# Patient Record
Sex: Female | Born: 1980 | Race: Black or African American | Hispanic: No | Marital: Married | Smoking: Never smoker
Health system: Southern US, Community
[De-identification: ages and names within clinical notes are randomized; demographics above are authoritative.]

## PROBLEM LIST (undated history)

## (undated) DIAGNOSIS — E669 Obesity, unspecified: Secondary | ICD-10-CM

## (undated) DIAGNOSIS — D649 Anemia, unspecified: Secondary | ICD-10-CM

## (undated) DIAGNOSIS — N939 Abnormal uterine and vaginal bleeding, unspecified: Secondary | ICD-10-CM

## (undated) DIAGNOSIS — D219 Benign neoplasm of connective and other soft tissue, unspecified: Secondary | ICD-10-CM

## (undated) DIAGNOSIS — N946 Dysmenorrhea, unspecified: Secondary | ICD-10-CM

## (undated) HISTORY — DX: Benign neoplasm of connective and other soft tissue, unspecified: D21.9

## (undated) HISTORY — PX: WISDOM TOOTH EXTRACTION: SHX21

## (undated) HISTORY — DX: Dysmenorrhea, unspecified: N94.6

## (undated) HISTORY — DX: Abnormal uterine and vaginal bleeding, unspecified: N93.9

## (undated) HISTORY — PX: ABDOMINAL HYSTERECTOMY: SHX81

---

## 2007-08-17 ENCOUNTER — Inpatient Hospital Stay (HOSPITAL_COMMUNITY): Admission: AD | Admit: 2007-08-17 | Discharge: 2007-08-17 | Payer: Self-pay | Admitting: Obstetrics and Gynecology

## 2007-08-24 ENCOUNTER — Inpatient Hospital Stay (HOSPITAL_COMMUNITY): Admission: AD | Admit: 2007-08-24 | Discharge: 2007-08-24 | Payer: Self-pay | Admitting: Obstetrics and Gynecology

## 2007-08-25 ENCOUNTER — Inpatient Hospital Stay (HOSPITAL_COMMUNITY): Admission: AD | Admit: 2007-08-25 | Discharge: 2007-08-25 | Payer: Self-pay | Admitting: Obstetrics and Gynecology

## 2007-10-23 ENCOUNTER — Inpatient Hospital Stay (HOSPITAL_COMMUNITY): Admission: AD | Admit: 2007-10-23 | Discharge: 2007-10-23 | Payer: Self-pay | Admitting: Obstetrics and Gynecology

## 2007-10-30 ENCOUNTER — Inpatient Hospital Stay (HOSPITAL_COMMUNITY): Admission: AD | Admit: 2007-10-30 | Discharge: 2007-10-30 | Payer: Self-pay | Admitting: Obstetrics and Gynecology

## 2007-11-07 ENCOUNTER — Inpatient Hospital Stay (HOSPITAL_COMMUNITY): Admission: AD | Admit: 2007-11-07 | Discharge: 2007-11-07 | Payer: Self-pay | Admitting: Obstetrics and Gynecology

## 2007-11-10 ENCOUNTER — Inpatient Hospital Stay (HOSPITAL_COMMUNITY): Admission: RE | Admit: 2007-11-10 | Discharge: 2007-11-10 | Payer: Self-pay | Admitting: Obstetrics and Gynecology

## 2007-11-18 ENCOUNTER — Inpatient Hospital Stay (HOSPITAL_COMMUNITY): Admission: RE | Admit: 2007-11-18 | Discharge: 2007-11-21 | Payer: Self-pay | Admitting: Obstetrics and Gynecology

## 2007-11-22 ENCOUNTER — Inpatient Hospital Stay (HOSPITAL_COMMUNITY): Admission: AD | Admit: 2007-11-22 | Discharge: 2007-11-22 | Payer: Self-pay | Admitting: Internal Medicine

## 2007-11-23 ENCOUNTER — Inpatient Hospital Stay (HOSPITAL_COMMUNITY): Admission: AD | Admit: 2007-11-23 | Discharge: 2007-11-23 | Payer: Self-pay | Admitting: Obstetrics and Gynecology

## 2007-11-25 ENCOUNTER — Emergency Department (HOSPITAL_COMMUNITY): Admission: EM | Admit: 2007-11-25 | Discharge: 2007-11-25 | Payer: Self-pay | Admitting: Emergency Medicine

## 2007-12-19 ENCOUNTER — Emergency Department (HOSPITAL_COMMUNITY): Admission: EM | Admit: 2007-12-19 | Discharge: 2007-12-19 | Payer: Self-pay | Admitting: Emergency Medicine

## 2007-12-23 ENCOUNTER — Emergency Department (HOSPITAL_COMMUNITY): Admission: EM | Admit: 2007-12-23 | Discharge: 2007-12-23 | Payer: Self-pay | Admitting: Emergency Medicine

## 2007-12-26 ENCOUNTER — Emergency Department (HOSPITAL_COMMUNITY): Admission: EM | Admit: 2007-12-26 | Discharge: 2007-12-27 | Payer: Self-pay | Admitting: Emergency Medicine

## 2007-12-27 ENCOUNTER — Emergency Department (HOSPITAL_COMMUNITY): Admission: EM | Admit: 2007-12-27 | Discharge: 2007-12-27 | Payer: Self-pay | Admitting: Emergency Medicine

## 2009-04-09 ENCOUNTER — Emergency Department (HOSPITAL_COMMUNITY): Admission: EM | Admit: 2009-04-09 | Discharge: 2009-04-09 | Payer: Self-pay | Admitting: Emergency Medicine

## 2009-08-05 IMAGING — CR DG CHEST 2V
2 series · 2 of 2 positions shown · non-contrast
Comparison: 11/23/07.

CLINICAL DATA: Shortness of breath.  Right chest pain.  Postpartum. 
 CHEST ? 2 VIEW:

[w chest pa]
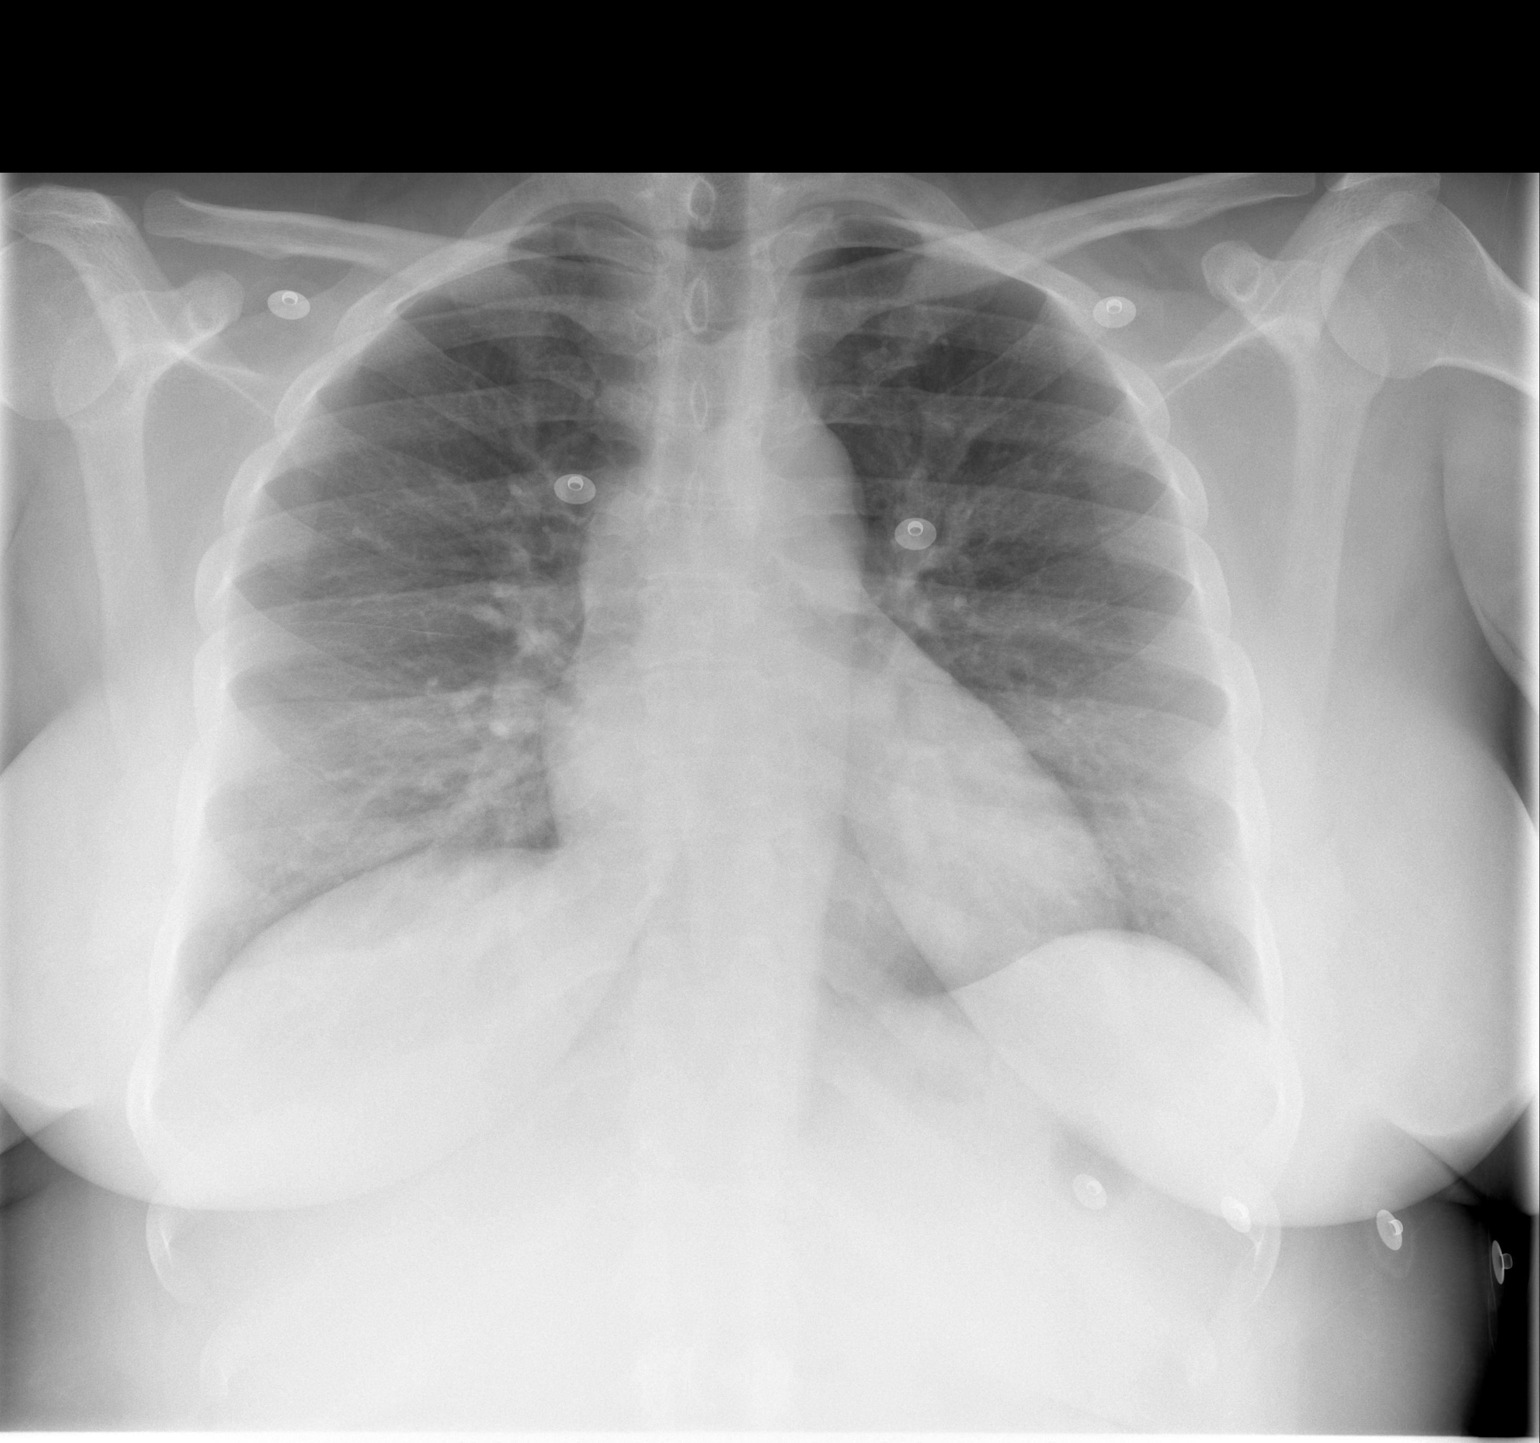

[w chest lat]
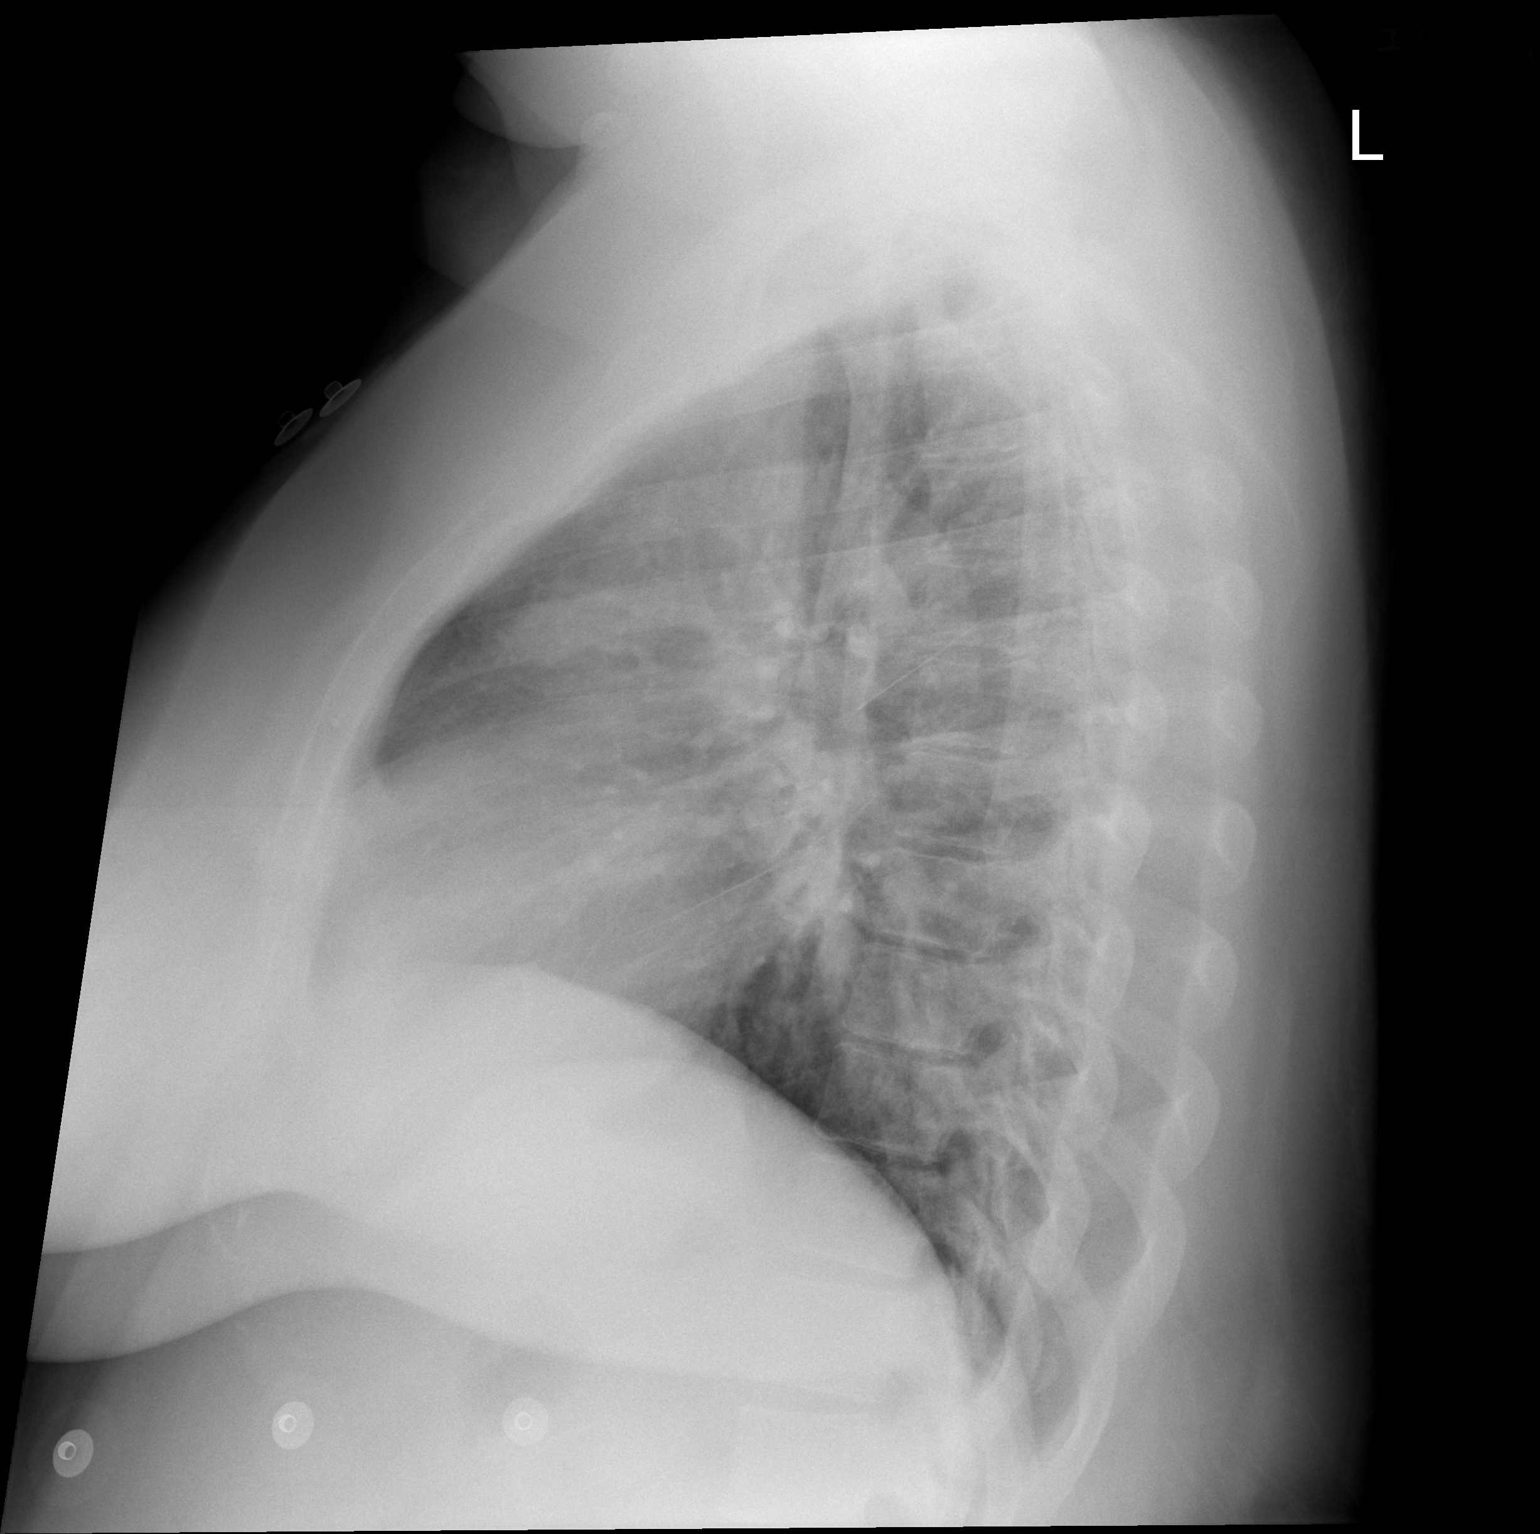

[2 of 2 positions shown; findings below may reference images not displayed]

FINDINGS: Cardiac and mediastinal contours appear unremarkable.  There is some faint linear opacity at the right lung base, which appears asymmetric and may represent bronchopneumonia.  The lungs appear otherwise clear.
IMPRESSION: Linear opacities at the right lung base raise the possibility of bronchopneumonia.

## 2009-12-29 ENCOUNTER — Other Ambulatory Visit: Admission: RE | Admit: 2009-12-29 | Discharge: 2009-12-29 | Payer: Self-pay | Admitting: Gynecology

## 2009-12-29 ENCOUNTER — Ambulatory Visit: Payer: Self-pay | Admitting: Gynecology

## 2010-10-07 ENCOUNTER — Encounter: Payer: Self-pay | Admitting: Obstetrics and Gynecology

## 2010-12-23 LAB — URINE MICROSCOPIC-ADD ON

## 2010-12-23 LAB — URINALYSIS, ROUTINE W REFLEX MICROSCOPIC
Glucose, UA: NEGATIVE mg/dL
Specific Gravity, Urine: 1.025 (ref 1.005–1.030)
pH: 6 (ref 5.0–8.0)

## 2011-01-25 ENCOUNTER — Encounter (INDEPENDENT_AMBULATORY_CARE_PROVIDER_SITE_OTHER): Payer: 59 | Admitting: Gynecology

## 2011-01-25 ENCOUNTER — Other Ambulatory Visit (HOSPITAL_COMMUNITY)
Admission: RE | Admit: 2011-01-25 | Discharge: 2011-01-25 | Disposition: A | Discharge status: Home / Self Care | Payer: 59 | Source: Ambulatory Visit | Attending: Gynecology | Admitting: Gynecology

## 2011-01-25 ENCOUNTER — Other Ambulatory Visit: Payer: Self-pay | Admitting: Gynecology

## 2011-01-25 DIAGNOSIS — Z13220 Encounter for screening for lipoid disorders: Secondary | ICD-10-CM

## 2011-01-25 DIAGNOSIS — Z833 Family history of diabetes mellitus: Secondary | ICD-10-CM

## 2011-01-25 DIAGNOSIS — Z124 Encounter for screening for malignant neoplasm of cervix: Secondary | ICD-10-CM | POA: Insufficient documentation

## 2011-01-25 DIAGNOSIS — Z01419 Encounter for gynecological examination (general) (routine) without abnormal findings: Secondary | ICD-10-CM

## 2011-01-25 DIAGNOSIS — R82998 Other abnormal findings in urine: Secondary | ICD-10-CM

## 2011-01-25 DIAGNOSIS — Z3041 Encounter for surveillance of contraceptive pills: Secondary | ICD-10-CM

## 2011-01-29 NOTE — Discharge Summary (Signed)
[name], [name]              ACCOUNT NO.:  [id number]   MEDICAL RECORD NO.:  [id number]          PATIENT TYPE:  INP   LOCATION:  9125                          FACILITY:  [facility]   PHYSICIAN:  Malachi Pro. Ambrose Mantle, M.D. DATE OF BIRTH:  1981-04-06   DATE OF ADMISSION:  11/18/2007  DATE OF DISCHARGE:  11/21/2007                               DISCHARGE SUMMARY   This is a 30 year old black female, para 0-1-1-1, gravida 3 who was at  39 weeks by last period compatible with an 8-week ultrasound with [facility] of  November 24, 2007 who presented for elective induction.  Blood group and  type was A+, negative antibody, RPR nonreactive, rubella immune,  hepatitis B surface antigen negative, HIV negative, GC and chlamydia  negative, cystic fibrosis negative, quad screen normal, 1-hour Glucola  118, group B strep negative.  The patient's prenatal course was  complicated by proteinuria with occasional slight increased blood  pressure and normal labs.  She had been on 17-hydroxyprogesterone for  prior preterm delivery thought to have a large for gestational age  infant.  She was followed with nonstress tests.  Her full prenatal  history and past medical history and physical exam are included in the  H&P.   In summary, the patient was admitted for induction, reached 8 cm  dilatation but thereafter failed to make progress and underwent a C-  section for arrest of dilatation and nonreassuring fetal heart rate  tracing.  After the C-section, the patient's progress was normal.  On  the evening of the second postop day, she did have a fever to 100.5 and  also had a chill, but her only symptom is that her breast milk came in.  she denied urinary/bowel symptoms.  She denied any abdominal tenderness.  Her incision looked good.  Staples removed and strips were applied.  Her  urine is going to be sent for urinalysis and culture on a cath specimen,  and she is discharged with instructions to take her temperature every  4  hours and report to me any fever above 100.4 degrees.  Initial  hemoglobin 11.7, hematocrit 35.0, white count 7,400, platelet count  381,000.  Follow-up hemoglobin 9.3.  RPR was nonreactive.   FINAL DIAGNOSES:  1. Intrauterine pregnancy at 39+ weeks with arrest of dilatation.  2. History of proteinuria.   OPERATION:  Low transverse cervical C-section.   FINAL CONDITION:  Improved.   INSTRUCTIONS:  Our regular discharge instruction booklet.  The patient  will have a catheterized urine specimen prior to discharge.  It will be  sent for urinalysis and culture.  The infant was a female infant, 8  pounds, 8 ounces, Apgars of 8 at 1 and 9 at 5 minutes, and there was a  nuchal cord x2.  Her staples have been removed and strips applied.   MEDICATIONS:  1. Percocet 5/325, 30 tablets, 1 every 4-6 hours as needed for pain.  2. Motrin 600 mg, 30 tablets, 1 every 6 hours as needed for pain.   She is to return to the office in 10 days for follow-up examination but  call with any fever or any other problems.      Malachi Pro. Ambrose Mantle, M.D.  Electronically Signed     TFH/MEDQ  D:  11/21/2007  T:  11/23/2007  Job:  [id number]

## 2011-01-29 NOTE — Op Note (Signed)
NAMEKHYLEI, [name]              ACCOUNT NO.:  [id number]   MEDICAL RECORD NO.:  [id number]          PATIENT TYPE:  INP   LOCATION:  9125                          FACILITY:  [facility]   PHYSICIAN:  Leighton Roach Meisinger, M.D.DATE OF BIRTH:  1980/11/26   DATE OF PROCEDURE:  11/18/2007  DATE OF DISCHARGE:                               OPERATIVE REPORT   PREOPERATIVE DIAGNOSES:  Intrauterine pregnancy at 39 weeks, arrest of  dilation, nonreassuring fetal heart tracing.   POSTOPERATIVE DIAGNOSES:  Intrauterine pregnancy at 39 weeks, arrest of  dilation, nonreassuring fetal heart tracing.   PROCEDURE:  Primary low transverse cesarean section without extensions.   SURGEON:  Zenaida Niece, M.D.   ANESTHESIA:  Epidural with added local block and some IV sedation.   SPECIMENS:  Placenta sent for cord blood collection and then to labor  and delivery.   FINDINGS:  The patient had normal gravid anatomy and delivered a viable  female infant with Apgars of 8 and 9 that weighed 8 pounds 8 ounces and  had a nuchal cord x2.   ESTIMATED BLOOD LOSS:  1200 mL.   COMPLICATIONS:  None.   PROCEDURE IN DETAIL:  The patient was taken to the operating room and  placed in the dorsal supine position with left lateral tilt.  Previously  placed epidural was dosed appropriately.  Abdomen was then prepped and  draped in the usual sterile fashion and a sterile drape applied.  The  level of her anesthesia was found to be adequate and abdomen was entered  via a standard Pfannenstiel incision.  During the incision she did  experience some discomfort just to the right of the midline and local  anesthetic was poured into the incision.  Epidural was further dosed and  she was able to tolerate the procedure.  Once the peritoneal cavity was  entered, an Alexis disposable self-retaining retractor was placed and  the lower uterine segment was well exposed.  A 4 cm transverse incision  was made in the lower uterine  segment.  Once the uterine cavity was  entered, the incision was extended bilaterally digitally.  The fetal  vertex was grasped and delivered through the incision atraumatically.  Mouth and nares were suctioned.  Nuchal cord x2 was reduced and the  remainder of the infant then delivered atraumatically.  Umbilical cord  was doubly clamped and cut and the infant handed to the awaiting  pediatric team.  Placenta delivered spontaneously and was sent for cord  blood collection.  The uterus was wiped dry with a clean lap pad and all  clots and debris removed.  Bowels did repeatedly protrude into the  incision and I was able to reduce this by removing the uterus and then  putting it back and pushing the bowel superior.  Uterine incision was  inspected and found to be free of extensions.  The uterine incision was  closed in one layer, being a running locking layer with #1 chromic.  The  needle came off towards the right angle, so another suture of #1 chromic  started at the right angle and met  this suture which was tied.  Some  bleeding from the right side was controlled with #1 chromic with  adequate hemostasis.  Tubes and ovaries were inspected and found to be  normal.  Uterine incision was again inspected and found to be  hemostatic.  Subfascial space was then irrigated and made hemostatic  with electrocautery.  Rectus muscles were reapproximated in the midline  with interrupted sutures of #1 chromic due to prolapsing omentum and  bowel.  Fascia was then closed in a running fashion starting at both  ends and meeting in the middle with 0 Vicryl.  Subcutaneous tissue was  then irrigated and made hemostatic with electrocautery.  Subcutaneous  tissue was then closed with running 2-0 plain gut suture.  Skin was  closed with staples followed by a sterile dressing.  The patient  tolerated the procedure well and was taken to the recovery room in  stable condition.  Counts were correct x2 and she was  given Ancef 1 g at  the beginning of the procedure.      Zenaida Niece, M.D.  Electronically Signed     TDM/MEDQ  D:  11/18/2007  T:  11/19/2007  Job:  [id number]

## 2011-06-07 LAB — URINALYSIS, ROUTINE W REFLEX MICROSCOPIC
Hgb urine dipstick: NEGATIVE
Specific Gravity, Urine: 1.02
Urobilinogen, UA: 0.2

## 2011-06-07 LAB — COMPREHENSIVE METABOLIC PANEL
ALT: 15
AST: 17
Calcium: 8.6
Creatinine, Ser: 0.55
GFR calc Af Amer: >60
Sodium: 137
Total Protein: 6.8

## 2011-06-07 LAB — LACTATE DEHYDROGENASE: LDH: 147

## 2011-06-07 LAB — CBC
MCHC: 32.8
RDW: 15.1

## 2011-06-10 LAB — COMPREHENSIVE METABOLIC PANEL
ALT: 22
Albumin: 2.2 — ABNORMAL LOW
Albumin: 2.4 — ABNORMAL LOW
Alkaline Phosphatase: 73
Alkaline Phosphatase: 83
BUN: 6
Calcium: 8.6
Calcium: 8.9
GFR calc Af Amer: >60
Potassium: 3.5
Potassium: 3.3 — ABNORMAL LOW
Sodium: 141
Total Protein: 6.2
Total Protein: 6.6

## 2011-06-10 LAB — URINALYSIS, ROUTINE W REFLEX MICROSCOPIC
Glucose, UA: NEGATIVE
Glucose, UA: NEGATIVE
Leukocytes, UA: NEGATIVE
Protein, ur: NEGATIVE
Specific Gravity, Urine: <1.005 — ABNORMAL LOW
Specific Gravity, Urine: 1.007
pH: 7.5
pH: 7.5

## 2011-06-10 LAB — CBC
HCT: 24.8 — ABNORMAL LOW
HCT: 27.9 — ABNORMAL LOW
Hemoglobin: 9.3 — ABNORMAL LOW
MCHC: 33.2
MCHC: 31.8
MCV: 79
Platelets: 381
Platelets: 432 — ABNORMAL HIGH
Platelets: 486 — ABNORMAL HIGH
RBC: 3.24 — ABNORMAL LOW
RBC: 3.48 — ABNORMAL LOW
RBC: 4.44
RDW: 15.1
RDW: 15.4
RDW: 14.7
WBC: 12.1 — ABNORMAL HIGH
WBC: 7.4
WBC: 8.2

## 2011-06-10 LAB — POCT CARDIAC MARKERS
Myoglobin, poc: 75.3
Operator id: 277751
Troponin i, poc: <0.05

## 2011-06-10 LAB — CULTURE, BLOOD (ROUTINE X 2): Culture: NO GROWTH

## 2011-06-10 LAB — B-NATRIURETIC PEPTIDE (CONVERTED LAB): Pro B Natriuretic peptide (BNP): 54

## 2011-06-10 LAB — DIFFERENTIAL
Basophils Relative: 0
Eosinophils Absolute: 0.1
Lymphs Abs: 2.3
Monocytes Absolute: 0.6
Monocytes Relative: 7
Neutro Abs: 6.6
Neutrophils Relative %: 68

## 2011-06-10 LAB — BLOOD GAS, ARTERIAL
Acid-Base Excess: 2.6 — ABNORMAL HIGH
FIO2: 0.21
pO2, Arterial: 88.9

## 2011-06-10 LAB — URINE MICROSCOPIC-ADD ON

## 2011-06-10 LAB — RPR: RPR Ser Ql: NONREACTIVE

## 2011-06-11 LAB — POCT I-STAT, CHEM 8
BUN: 10
Calcium, Ion: 1.15
Chloride: 103
Chloride: 103
Creatinine, Ser: 0.8
Glucose, Bld: 87
Glucose, Bld: 97
HCT: 33 — ABNORMAL LOW
Potassium: 3.8
Sodium: 140
TCO2: 26

## 2011-06-11 LAB — CBC
Hemoglobin: 10.1 — ABNORMAL LOW
MCHC: 32.8
MCV: 77.8 — ABNORMAL LOW
Platelets: 454 — ABNORMAL HIGH
RBC: 3.89
RBC: 4.04
WBC: 5.9
WBC: 7

## 2011-06-11 LAB — DIFFERENTIAL
Lymphocytes Relative: 32
Lymphs Abs: 2.2
Monocytes Relative: 10
Neutrophils Relative %: 54

## 2011-06-11 LAB — D-DIMER, QUANTITATIVE: D-Dimer, Quant: 0.46

## 2011-06-11 LAB — POCT CARDIAC MARKERS
CKMB, poc: <1 — ABNORMAL LOW
CKMB, poc: <1 — ABNORMAL LOW
Myoglobin, poc: 39.1
Myoglobin, poc: 43.5
Operator id: 294521
Troponin i, poc: <0.05

## 2011-06-11 LAB — B-NATRIURETIC PEPTIDE (CONVERTED LAB): Pro B Natriuretic peptide (BNP): <30

## 2011-06-24 LAB — URINE MICROSCOPIC-ADD ON

## 2011-06-24 LAB — URINALYSIS, ROUTINE W REFLEX MICROSCOPIC
Ketones, ur: >80 — AB
Nitrite: NEGATIVE
Protein, ur: 30 — AB
pH: 6

## 2011-08-06 ENCOUNTER — Encounter: Payer: Self-pay | Admitting: Medical

## 2011-08-06 ENCOUNTER — Ambulatory Visit (INDEPENDENT_AMBULATORY_CARE_PROVIDER_SITE_OTHER): Payer: 59 | Admitting: Medical

## 2011-08-06 VITALS — BP 128/80 | HR 88 | Temp 98.0°F | Resp 12 | Ht 63.0 in | Wt 276.0 lb

## 2011-08-06 DIAGNOSIS — Z0000 Encounter for general adult medical examination without abnormal findings: Secondary | ICD-10-CM

## 2011-08-06 DIAGNOSIS — Z23 Encounter for immunization: Secondary | ICD-10-CM

## 2011-08-06 DIAGNOSIS — N63 Unspecified lump in unspecified breast: Secondary | ICD-10-CM

## 2011-08-06 DIAGNOSIS — N6320 Unspecified lump in the left breast, unspecified quadrant: Secondary | ICD-10-CM

## 2011-08-06 DIAGNOSIS — E669 Obesity, unspecified: Secondary | ICD-10-CM

## 2011-08-06 LAB — POCT URINALYSIS DIPSTICK
Bilirubin, UA: NEGATIVE
Glucose, UA: NEGATIVE
Nitrite, UA: NEGATIVE
Urobilinogen, UA: NEGATIVE

## 2011-08-06 NOTE — Progress Notes (Signed)
Subjective:   HPI  [name] is a 30 y.o. female who presents for a complete physical.  She is a new patient today.  Was going to [facility] prior.  Last physical 3 years ago.  She notes a knot on left breast x 2 months, not going away.  She is concerned about this.  Denies redness, drainage, or heat.  Denies injury or trauma.  Sees gynecology, last pap smear 5/12, normal.  Last tetanus shot probably > 10 years.  Declines flu vaccine.  She wants advise on her weight.  She walks for exercise but eats 2 meals a day, and one of which is usually fast food.  She has had a faint heart murmur, asymptomatic since her last pregnancy.  No echocardiogram or other evaluation to date.   Reviewed their medical, surgical, family, social, medication, and allergy history and updated chart as appropriate.   Past Medical History  Diagnosis Date  . History of cesarean section 2009  . Pneumonia 2007    Past Surgical History  Procedure Date  . Cesarean section     Family History  Problem Relation Age of Onset  . Diabetes Mother   . Hypertension Mother   . Cancer Maternal Aunt     breast  . Cancer Maternal Grandmother     breast  . Heart disease Neg Hx   . Stroke Neg Hx     History   Social History  . Marital Status: Married    Spouse Name: N/A    Number of Children: N/A  . Years of Education: N/A   Occupational History  . Customer Service  At [employer]   Social History Main Topics  . Smoking status: Never Smoker   . Smokeless tobacco: Not on file  . Alcohol Use: No  . Drug Use: No  . Sexually Active:    Other Topics Concern  . Not on file   Social History Narrative   Married, children ages 5yo and 3yo, walks for exercise    Current Outpatient Prescriptions on File Prior to Visit  Medication Sig Dispense Refill  . norethindrone-ethinyl estradiol (MICROGESTIN 1/20) 1-20 MG-MCG per tablet Take 1 tablet by mouth daily.          No Known Allergies  Review of  Systems Constitutional: -fever, -chills, -sweats, -unexpected weight change, -anorexia, -fatigue Allergy: -sneezing, -itching, -congestion Dermatology: denies changing moles, rash, +lumps, new worrisome lesions ENT: +runny nose, -ear pain, -sore throat, -hoarseness, -sinus pain, -teeth pain, -tinnitus, -hearing loss, -epistaxis Cardiology:  -chest pain, -palpitations, -edema, -orthopnea, -paroxysmal nocturnal dyspnea Respiratory: -cough, -shortness of breath, -dyspnea on exertion, -wheezing, -hemoptysis Gastroenterology: -abdominal pain, -nausea, -vomiting, -diarrhea, -constipation, -blood in stool, -changes in bowel movement, -dysphagia Hematology: -bleeding or bruising problems Musculoskeletal: -arthralgias, -myalgias, -joint swelling, -back pain, -neck pain, -cramping, -gait changes Ophthalmology: -vision changes, -eye redness, -itching, -discharge Urology: -dysuria, -difficulty urinating, -hematuria, -urinary frequency, -urgency, incontinence Neurology: -headache, -weakness, -tingling, -numbness, -speech abnormality, -memory loss, -falls, -dizziness Psychology:  -depressed mood, -agitation, -sleep problems    Objective:   Physical Exam  Filed Vitals:   08/06/11 0848  BP: 128/80  Pulse: 88  Temp: 98 F (36.7 C)    General appearance: alert, no distress, WD/WN, obese black female Skin: no worrisome lesions HEENT: normocephalic, conjunctiva/corneas normal, sclerae anicteric, PERRLA, EOMi, nares patent, no discharge or erythema, pharynx normal Oral cavity: MMM, tongue normal, teeth in good repair Neck: supple, no lymphadenopathy, no thyromegaly, no masses, normal ROM, no bruits Chest:  non tender, normal shape and expansion Heart: RRR, normal S1, S2, faint I-II/VI systolic brief murmur  Lungs: CTA bilaterally, no wheezes, rhonchi, or rales Abdomen: +bs, soft, non tender, non distended, no masses, no hepatomegaly, no splenomegaly, no bruits Back: non tender, normal ROM, no  scoliosis Musculoskeletal: upper extremities non tender, no obvious deformity, normal ROM throughout, lower extremities non tender, no obvious deformity, normal ROM throughout Extremities: no edema, no cyanosis, no clubbing Pulses: 2+ symmetric, upper and lower extremities, normal cap refill Neurological: alert, oriented x 3, CN2-12 intact, strength normal upper extremities and lower extremities, sensation normal throughout, DTRs 2+ throughout, no cerebellar signs, gait normal Psychiatric: normal affect, behavior normal, pleasant  Breast: left breast medially at skin fold with 8mm nodular lesion that is most suggestive of cyst, but its not well defined or well formed.  Its is superficial. No induration, erythema, fluctuance. Gyn/rectal: deferred.     Assessment and Plan :    Encounter Diagnoses  Name Primary?  . Routine general medical examination at a health care facility Yes  . Lump of breast, left   . Need for Tdap vaccination   . Obesity     Physical exam - discussed healthy lifestyle, diet, exercise, preventative care, vaccinations, and addressed their concerns.  Handout given.  Lump - most likely a cyst, superficial.  Advised warm compresses, and recheck 32mo.  Updated Tdap vaccine today.  Obesity - discussed diet, weight loss strategies.  Cut out fast food, increase walking and intensity of exercise.  Will call with lab results.   Follow-up pending labs.

## 2011-08-06 NOTE — Patient Instructions (Addendum)
Obesity Obesity is defined as having a body mass index (BMI) of 30 or more. To calculate your BMI divide your weight in pounds by your height in inches squared and multiply that product by 703. Major illnesses resulting from long-term obesity include:  Stroke.   Heart disease.   Diabetes.   Many cancers.   Arthritis.  Obesity also complicates recovery from many other medical problems.  CAUSES   A history of obesity in your parents.   Thyroid hormone imbalance.   Environmental factors such as excess calorie intake and physical inactivity.  TREATMENT  A healthy weight loss program includes:  A calorie restricted diet based on individual calorie needs.   Increased physical activity (exercise).  An exercise program is just as important as the right low-calorie diet.  Weight-loss medicines should be used only under the supervision of your physician. These medicines help, but only if they are used with diet and exercise programs. Medicines can have side effects including nervousness, nausea, abdominal pain, diarrhea, headache, drowsiness, and depression.  An unhealthy weight loss program includes:  Fasting.   Fad diets.   Supplements and drugs.  These choices do not succeed in long-term weight control.  HOME CARE INSTRUCTIONS  To help you make the needed dietary changes:   Exercise and perform physical activity as directed by your caregiver.   Keep a daily record of everything you eat. There are many free websites to help you with this. It may be helpful to measure your foods so you can determine if you are eating the correct portion sizes.   Use low-calorie cookbooks or take special cooking classes.   Avoid alcohol. Drink more water and drinks with no calories.   Take vitamins and supplements only as recommended by your caregiver.   Weight loss support groups, Registered Dieticians, counselors, and stress reduction education can also be very helpful.  Document Released:  10/10/2004 Document Revised: 05/15/2011 Document Reviewed: 08/09/2007 [facility] Patient Information 2012 [place], [place].    Preventative Care for Adults - Female      MAINTAIN REGULAR HEALTH EXAMS:  A routine yearly physical is a good way to check in with your primary care provider about your health and preventive screening. It is also an opportunity to share updates about your health and any concerns you have, and receive a thorough all-over exam.   Most health insurance companies pay for at least some preventative services.  Check with your health plan for specific coverages.  WHAT PREVENTATIVE SERVICES DO WOMEN NEED?  Adult women should have their weight and blood pressure checked regularly.   Women age 52 and older should have their cholesterol levels checked regularly.  Women should be screened for cervical cancer with a Pap smear and pelvic exam beginning at either age 68, or 3 years after they become sexually activity.    Breast cancer screening generally begins at age 62 with a mammogram and breast exam by your primary care provider.    Beginning at age 71 and continuing to age 86, women should be screened for colorectal cancer.  Certain people may need continued testing until age 4.  Updating vaccinations is part of preventative care.  Vaccinations help protect against diseases such as the flu.  Osteoporosis is a disease in which the bones lose minerals and strength as we age. Women ages 55 and over should discuss this with their caregivers, as should women after menopause who have other risk factors.  Lab tests are generally done as part of  preventative care to screen for anemia and blood disorders, to screen for problems with the kidneys and liver, to screen for bladder problems, to check blood sugar, and to check your cholesterol level.  Preventative services generally include counseling about diet, exercise, avoiding tobacco, drugs, excessive alcohol consumption, and  sexually transmitted infections.    GENERAL RECOMMENDATIONS FOR GOOD HEALTH:  Healthy diet:  Eat a variety of foods, including fruit, vegetables, animal or vegetable protein, such as meat, fish, chicken, and eggs, or beans, lentils, tofu, and grains, such as rice.  Drink plenty of water daily.  Decrease saturated fat in the diet, avoid lots of red meat, processed foods, sweets, fast foods, and fried foods.  Exercise:  Aerobic exercise helps maintain good heart health. At least 30-40 minutes of moderate-intensity exercise is recommended. For example, a brisk walk that increases your heart rate and breathing. This should be done on most days of the week.   Find a type of exercise or a variety of exercises that you enjoy so that it becomes a part of your daily life.  Examples are running, walking, swimming, water aerobics, and biking.  For motivation and support, explore group exercise such as aerobic class, spin class, Zumba, Yoga,or  martial arts, etc.    Set exercise goals for yourself, such as a certain weight goal, walk or run in a race such as a 5k walk/run.  Speak to your primary care provider about exercise goals.  Disease prevention:  If you smoke or chew tobacco, find out from your caregiver how to quit. It can literally save your life, no matter how long you have been a tobacco user. If you do not use tobacco, never begin.   Maintain a healthy diet and normal weight. Increased weight leads to problems with blood pressure and diabetes.   The Body Mass Index or BMI is a way of measuring how much of your body is fat. Having a BMI above 27 increases the risk of heart disease, diabetes, hypertension, stroke and other problems related to obesity. Your caregiver can help determine your BMI and based on it develop an exercise and dietary program to help you achieve or maintain this important measurement at a healthful level.  High blood pressure causes heart and blood vessel problems.   Persistent high blood pressure should be treated with medicine if weight loss and exercise do not work.   Fat and cholesterol leaves deposits in your arteries that can block them. This causes heart disease and vessel disease elsewhere in your body.  If your cholesterol is found to be high, or if you have heart disease or certain other medical conditions, then you may need to have your cholesterol monitored frequently and be treated with medication.   Ask if you should have a cardiac stress test if your history suggests this. A stress test is a test done on a treadmill that looks for heart disease. This test can find disease prior to there being a problem.  Menopause can be associated with physical symptoms and risks. Hormone replacement therapy is available to decrease these. You should talk to your caregiver about whether starting or continuing to take hormones is right for you.   Osteoporosis is a disease in which the bones lose minerals and strength as we age. This can result in serious bone fractures. Risk of osteoporosis can be identified using a bone density scan. Women ages 101 and over should discuss this with their caregivers, as should women after menopause who  have other risk factors. Ask your caregiver whether you should be taking a calcium supplement and Vitamin D, to reduce the rate of osteoporosis.   Avoid drinking alcohol in excess (more than two drinks per day).  Avoid use of street drugs. Do not share needles with anyone. Ask for professional help if you need assistance or instructions on stopping the use of alcohol, cigarettes, and/or drugs.  Brush your teeth twice a day with fluoride toothpaste, and floss once a day. Good oral hygiene prevents tooth decay and gum disease. The problems can be painful, unattractive, and can cause other health problems. Visit your dentist for a routine oral and dental check up and preventive care every 6-12 months.   Look at your skin regularly.  Use a  mirror to look at your back. Notify your caregivers of changes in moles, especially if there are changes in shapes, colors, a size larger than a pencil eraser, an irregular border, or development of new moles.  Safety:  Use seatbelts 100% of the time, whether driving or as a passenger.  Use safety devices such as hearing protection if you work in environments with loud noise or significant background noise.  Use safety glasses when doing any work that could send debris in to the eyes.  Use a helmet if you ride a bike or motorcycle.  Use appropriate safety gear for contact sports.  Talk to your caregiver about gun safety.  Use sunscreen with a SPF (or skin protection factor) of 15 or greater.  Lighter skinned people are at a greater risk of skin cancer. Don't forget to also wear sunglasses in order to protect your eyes from too much damaging sunlight. Damaging sunlight can accelerate cataract formation.   Practice safe sex. Use condoms. Condoms are used for birth control and to help reduce the spread of sexually transmitted infections (or STIs).  Some of the STIs are gonorrhea (the clap), chlamydia, syphilis, trichomonas, herpes, HPV (human papilloma virus) and HIV (human immunodeficiency virus) which causes AIDS. The herpes, HIV and HPV are viral illnesses that have no cure. These can result in disability, cancer and death.   Keep carbon monoxide and smoke detectors in your home functioning at all times. Change the batteries every 6 months or use a model that plugs into the wall.   Vaccinations:  Stay up to date with your tetanus shots and other required immunizations. You should have a booster for tetanus every 10 years. Be sure to get your flu shot every year, since 5%-20% of the [place] population comes down with the flu. The flu vaccine changes each year, so being vaccinated once is not enough. Get your shot in the fall, before the flu season peaks.   Other vaccines to consider:  Human Papilloma  Virus or HPV causes cancer of the cervix, and other infections that can be transmitted from person to person. There is a vaccine for HPV, and females should get immunized between the ages of 18 and 86. It requires a series of 3 shots.   Pneumococcal vaccine to protect against certain types of pneumonia.  This is normally recommended for adults age 73 or older.  However, adults younger than 31 years old with certain underlying conditions such as diabetes, heart or lung disease should also receive the vaccine.  Shingles vaccine to protect against Varicella Zoster if you are older than age 29, or younger than 31 years old with certain underlying illness.  Hepatitis A vaccine to protect against a form of  infection of the liver by a virus acquired from food.  Hepatitis B vaccine to protect against a form of infection of the liver by a virus acquired from blood or body fluids, particularly if you work in health care.  If you plan to travel internationally, check with your local health department for specific vaccination recommendations.  Cancer Screening:  Breast cancer screening is essential to preventive care for women. All women age 75 and older should perform a breast self-exam every month. At age 51 and older, women should have their caregiver complete a breast exam each year. Women at ages 7 and older should have a mammogram (x-ray film) of the breasts. Your caregiver can discuss how often you need mammograms.    Cervical cancer screening includes taking a Pap smear (sample of cells examined under a microscope) from the cervix (end of the uterus). It also includes testing for HPV (Human Papilloma Virus, which can cause cervical cancer). Screening and a pelvic exam should begin at age 70, or 3 years after a woman becomes sexually active. Screening should occur every year, with a Pap smear but no HPV testing, up to age 29. After age 84, you should have a Pap smear every 3 years with HPV testing, if no  HPV was found previously.   Most routine colon cancer screening begins at the age of 34. On a yearly basis, doctors may provide special easy to use take-home tests to check for hidden blood in the stool. Sigmoidoscopy or colonoscopy can detect the earliest forms of colon cancer and is life saving. These tests use a small camera at the end of a tube to directly examine the colon. Speak to your caregiver about this at age 83, when routine screening begins (and is repeated every 5 years unless early forms of pre-cancerous polyps or small growths are found).

## 2011-08-07 LAB — COMPREHENSIVE METABOLIC PANEL
ALT: 15 U/L (ref 0–35)
AST: 14 U/L (ref 0–37)
Albumin: 3.8 g/dL (ref 3.5–5.2)
Alkaline Phosphatase: 69 U/L (ref 39–117)
Potassium: 4 mEq/L (ref 3.5–5.3)
Sodium: 138 mEq/L (ref 135–145)
Total Protein: 7.2 g/dL (ref 6.0–8.3)

## 2011-08-07 LAB — LIPID PANEL
Cholesterol: 189 mg/dL (ref 0–200)
Triglycerides: 67 mg/dL (ref <150)

## 2011-08-07 LAB — CBC WITH DIFFERENTIAL/PLATELET
Basophils Relative: 0 % (ref 0–1)
Eosinophils Absolute: 0.2 10*3/uL (ref 0.0–0.7)
MCH: 25.5 pg — ABNORMAL LOW (ref 26.0–34.0)
MCHC: 31.2 g/dL (ref 30.0–36.0)
Monocytes Relative: 8 % (ref 3–12)
Neutrophils Relative %: 49 % (ref 43–77)
Platelets: 483 10*3/uL — ABNORMAL HIGH (ref 150–400)
RDW: 13.5 % (ref 11.5–15.5)

## 2012-01-30 ENCOUNTER — Ambulatory Visit (INDEPENDENT_AMBULATORY_CARE_PROVIDER_SITE_OTHER): Payer: 59 | Admitting: Gynecology

## 2012-01-30 ENCOUNTER — Encounter: Payer: Self-pay | Admitting: Gynecology

## 2012-01-30 VITALS — BP 122/86 | Ht 63.0 in | Wt 265.0 lb

## 2012-01-30 DIAGNOSIS — Z131 Encounter for screening for diabetes mellitus: Secondary | ICD-10-CM

## 2012-01-30 DIAGNOSIS — Z13220 Encounter for screening for lipoid disorders: Secondary | ICD-10-CM

## 2012-01-30 DIAGNOSIS — Z01419 Encounter for gynecological examination (general) (routine) without abnormal findings: Secondary | ICD-10-CM

## 2012-01-30 LAB — GLUCOSE, RANDOM: Glucose, Bld: 87 mg/dL (ref 70–99)

## 2012-01-30 LAB — LIPID PANEL
HDL: 53 mg/dL (ref >39)
Total CHOL/HDL Ratio: 3.2 Ratio
Triglycerides: 73 mg/dL (ref <150)

## 2012-01-30 NOTE — Patient Instructions (Signed)
Follow up with birth control decision. Otherwise follow up in one year for annual gynecologic exam.

## 2012-01-30 NOTE — Progress Notes (Signed)
[name] [birth year] [id number]        31 y.o.  for annual exam.  Wants to talk about birth control options.  Past medical history,surgical history, medications, allergies, family history and social history were all reviewed and documented in the EPIC chart. ROS:  Was performed and pertinent positives and negatives are included in the history.  Exam: Sherrilyn Rist chaperone present Filed Vitals:   01/30/12 1539  BP: 122/86   General appearance  Normal Skin grossly normal Head/Neck normal with no cervical or supraclavicular adenopathy thyroid normal Lungs  clear Cardiac RR, without RMG Abdominal  soft, nontender, without masses, organomegaly or hernia Breasts  examined lying and sitting without masses, retractions, discharge or axillary adenopathy. Pelvic  Ext/BUS/vagina  normal   Cervix  normal   Uterus  anteverted, normal size, shape and contour, midline and mobile nontender   Adnexa  Without masses or tenderness    Anus and perineum  normal   Rectovaginal  normal sphincter tone without palpated masses or tenderness.    Assessment/Plan:  31 y.o. female for annual exam.    1. Contraceptive management. I again reviewed options with her to include pill patch ring, Depo-Provera, Implanon, Mirena/ParaGard IUD sterilization to include laparoscopic tubal, Essure and vasectomy. Risks/benefits, pros/cons reviewed for each method. I strongly recommended she consider ring IUD as her periods are moderate in that this will help lighten them as well as provide her with contraception. The issues of surgery and risks, noting her weight, to include internal organ damage, recovery issues and expense were all reviewed. She wants to think about options we'll follow up with her decision. She's currently off of the birth control pills and need for contraception reviewed. 2. Pap smear. Patient has no history of abnormal Pap smears with last Pap 2012. She has several normal records in her chart. No Pap was done  today. I discussed current screening recommendations and will plan on every 3 to 5 year interval. 3. Breast health. SBE monthly reviewed. Start mammograms closer to 40. 4. Health maintenance. Baseline CBC glucose lipid profile urinalysis ordered.    Dara Lords MD, 4:02 PM 01/30/2012

## 2012-01-31 LAB — CBC WITH DIFFERENTIAL/PLATELET
Eosinophils Absolute: 0.2 10*3/uL (ref 0.0–0.7)
Eosinophils Relative: 3 % (ref 0–5)
Hemoglobin: 11.9 g/dL — ABNORMAL LOW (ref 12.0–15.0)
Lymphocytes Relative: 45 % (ref 12–46)
Lymphs Abs: 3.1 10*3/uL (ref 0.7–4.0)
MCH: 25.9 pg — ABNORMAL LOW (ref 26.0–34.0)
MCV: 79.3 fL (ref 78.0–100.0)
Monocytes Relative: 7 % (ref 3–12)
Neutrophils Relative %: 45 % (ref 43–77)
RBC: 4.6 MIL/uL (ref 3.87–5.11)
WBC: 6.9 10*3/uL (ref 4.0–10.5)

## 2012-01-31 LAB — URINALYSIS W MICROSCOPIC + REFLEX CULTURE
Casts: NONE SEEN
Glucose, UA: NEGATIVE mg/dL
Hgb urine dipstick: NEGATIVE
Protein, ur: NEGATIVE mg/dL
pH: 6 (ref 5.0–8.0)

## 2012-02-01 LAB — URINE CULTURE
Colony Count: NO GROWTH
Organism ID, Bacteria: NO GROWTH

## 2012-02-06 ENCOUNTER — Encounter: Payer: Self-pay | Admitting: *Deleted

## 2012-02-06 NOTE — Progress Notes (Signed)
Patient ID: [name], female   DOB: 09-05-1981, 31 y.o.   MRN: [id number] Per Tiffany at [facility] insurance covers 100% of Mirena and insertion. KW

## 2012-02-17 ENCOUNTER — Encounter: Payer: Self-pay | Admitting: Gynecology

## 2012-02-17 ENCOUNTER — Ambulatory Visit (INDEPENDENT_AMBULATORY_CARE_PROVIDER_SITE_OTHER): Payer: 59 | Admitting: Gynecology

## 2012-02-17 DIAGNOSIS — Z30430 Encounter for insertion of intrauterine contraceptive device: Secondary | ICD-10-CM

## 2012-02-17 MED ORDER — LEVONORGESTREL 20 MCG/24HR IU IUD: INTRAUTERINE_SYSTEM | Freq: Once | INTRAUTERINE | Status: DC

## 2012-02-17 NOTE — Patient Instructions (Signed)
Follow up in one month for post IUD insertional check up. 

## 2012-02-17 NOTE — Progress Notes (Signed)
Patient presents for Mirena IUD placement. She has read the literature and has decided to go ahead with the IUD. She is on a normal menses currently. I reviewed with her the insertional process and the risks to include infection, uterine perforation requiring surgery to remove, migration and failure with pregnancy. Patient has no questions, she has read through the booklet and signed the consent form.  Exam with Sherrilyn Rist chaperone present External BUS vagina normal. Cervix normal with slight menses flow. Uterus normal size midline mobile nontender. Adnexa without masses or tenderness.  Procedure: Cervix was visualized, cleansed with Betadine, grasped with a single-tooth tenaculum and sounded in a Mirena IUD was placed according to manufacturer's recommendations without difficulty. The strings were trimmed and the patient tolerated well. She will follow up in one month for a postinsertional check.

## 2012-02-18 ENCOUNTER — Ambulatory Visit: Payer: 59 | Admitting: Gynecology

## 2012-03-18 ENCOUNTER — Ambulatory Visit: Payer: 59 | Admitting: Gynecology

## 2012-03-26 ENCOUNTER — Encounter: Payer: Self-pay | Admitting: Gynecology

## 2012-03-26 ENCOUNTER — Ambulatory Visit (INDEPENDENT_AMBULATORY_CARE_PROVIDER_SITE_OTHER): Payer: 59 | Admitting: Gynecology

## 2012-03-26 DIAGNOSIS — Z30431 Encounter for routine checking of intrauterine contraceptive device: Secondary | ICD-10-CM

## 2012-03-26 NOTE — Progress Notes (Signed)
Patient presents in follow up having had a Mirena IUD placed last month. She's done well without significant irregular bleeding or pain.  Exam with Sherrilyn Rist Asst. Abdomen soft nontender without masses guarding rebound organomegaly. Pelvic external BUS vagina normal. Cervix normal with IUD string visualized at external os. Uterus normal size midline mobile nontender. Adnexa without masses or tenderness.  Assessment and plan: IUD follow up check doing well. Will keep menstrual calendar. We'll follow of May 2014 for annual exam. Sooner follow up if significant irregular bleeding or other issues.

## 2012-03-26 NOTE — Patient Instructions (Signed)
Keep menstrual calendar. As long as not significant irregular bleeding follow up May 2014 for annual exam, sooner if any issues.

## 2013-03-09 ENCOUNTER — Encounter: Payer: Self-pay | Admitting: Gynecology

## 2013-03-29 ENCOUNTER — Encounter: Payer: Self-pay | Admitting: Gynecology

## 2013-03-29 ENCOUNTER — Ambulatory Visit (INDEPENDENT_AMBULATORY_CARE_PROVIDER_SITE_OTHER): Payer: 59 | Admitting: Gynecology

## 2013-03-29 VITALS — BP 130/80 | Ht 62.0 in | Wt 250.0 lb

## 2013-03-29 DIAGNOSIS — N611 Abscess of the breast and nipple: Secondary | ICD-10-CM

## 2013-03-29 DIAGNOSIS — L02239 Carbuncle of trunk, unspecified: Secondary | ICD-10-CM

## 2013-03-29 DIAGNOSIS — Z01419 Encounter for gynecological examination (general) (routine) without abnormal findings: Secondary | ICD-10-CM

## 2013-03-29 DIAGNOSIS — Z30431 Encounter for routine checking of intrauterine contraceptive device: Secondary | ICD-10-CM

## 2013-03-29 NOTE — Patient Instructions (Signed)
Follow up in one year, sooner as needed. 

## 2013-03-29 NOTE — Progress Notes (Signed)
[name] [birth year] [id number]        32 y.o.  [id number] for annual exam.  Doing well without complaints.  Past medical history,surgical history, medications, allergies, family history and social history were all reviewed and documented in the EPIC chart.  ROS:  Performed and pertinent positives and negatives are included in the history, assessment and plan .  Exam: Kim assistant Filed Vitals:   03/29/13 1429  BP: 130/80  Height: 5\' 2"  (1.575 m)  Weight: 250 lb (113.399 kg)   General appearance  Normal Skin grossly normal Head/Neck normal with no cervical or supraclavicular adenopathy thyroid normal Lungs  clear Cardiac RR, without RMG Abdominal  soft, nontender, without masses, organomegaly or hernia Breasts  examined lying and sitting without masses, retractions, discharge or axillary adenopathy. Small boil left breast 7:00 periphery Pelvic  Ext/BUS/vagina  normal   Cervix  normal with IUD string visualized  Uterus  anteverted, normal size, shape and contour, midline and mobile nontender   Adnexa  Without masses or tenderness    Anus and perineum  normal   Rectovaginal  normal sphincter tone without palpated masses or tenderness.    Assessment/Plan:  32 y.o. [id number] female for annual exam.   1. Patient notes some recurrent bilateral breast boils that come and go. She does have a smaller now that had drained and is resolving. They're classic in appearance. I reviewed symptomatic treatment with warm soaks when they occur. If she would have any persistent or enlarging areas she'll present for evaluation. SBE monthly reviewed. 2. Mirena IUD 02/2012. Doing well with scant to absent menses. IUD string visualized. 3. Pap smear 2012. A Pap smear done today. No history of abnormal Pap smears. Plan repeat next year 3 year interval. 4. Health maintenance. No blood work done today as it is all done through her primary physician's office. Followup one year, sooner as needed.  Note:  This document was prepared with digital dictation and possible smart phrase technology. Any transcriptional errors that result from this process are unintentional.   Dara Lords MD, 2:49 PM 03/29/2013

## 2013-03-30 LAB — URINALYSIS W MICROSCOPIC + REFLEX CULTURE
Bacteria, UA: NONE SEEN
Bilirubin Urine: NEGATIVE
Casts: NONE SEEN
Crystals: NONE SEEN
Ketones, ur: NEGATIVE mg/dL
Specific Gravity, Urine: 1.03 (ref 1.005–1.030)
pH: 6.5 (ref 5.0–8.0)

## 2013-04-20 ENCOUNTER — Encounter (HOSPITAL_COMMUNITY): Payer: Self-pay | Admitting: Emergency Medicine

## 2013-04-20 ENCOUNTER — Emergency Department (HOSPITAL_COMMUNITY)
Admission: EM | Admit: 2013-04-20 | Discharge: 2013-04-21 | Disposition: A | Discharge status: Home / Self Care | Payer: 59 | Attending: Emergency Medicine | Admitting: Emergency Medicine

## 2013-04-20 ENCOUNTER — Emergency Department (HOSPITAL_COMMUNITY): Payer: 59

## 2013-04-20 DIAGNOSIS — R000 Tachycardia, unspecified: Secondary | ICD-10-CM | POA: Insufficient documentation

## 2013-04-20 DIAGNOSIS — Y9389 Activity, other specified: Secondary | ICD-10-CM | POA: Insufficient documentation

## 2013-04-20 DIAGNOSIS — Y92410 Unspecified street and highway as the place of occurrence of the external cause: Secondary | ICD-10-CM | POA: Insufficient documentation

## 2013-04-20 DIAGNOSIS — S0990XA Unspecified injury of head, initial encounter: Secondary | ICD-10-CM | POA: Insufficient documentation

## 2013-04-20 DIAGNOSIS — E669 Obesity, unspecified: Secondary | ICD-10-CM | POA: Insufficient documentation

## 2013-04-20 DIAGNOSIS — S161XXA Strain of muscle, fascia and tendon at neck level, initial encounter: Secondary | ICD-10-CM

## 2013-04-20 DIAGNOSIS — S139XXA Sprain of joints and ligaments of unspecified parts of neck, initial encounter: Secondary | ICD-10-CM | POA: Insufficient documentation

## 2013-04-20 DIAGNOSIS — Z8701 Personal history of pneumonia (recurrent): Secondary | ICD-10-CM | POA: Insufficient documentation

## 2013-04-20 HISTORY — DX: Obesity, unspecified: E66.9

## 2013-04-20 MED ORDER — CYCLOBENZAPRINE HCL 10 MG PO TABS
5.0000 mg | ORAL_TABLET | Freq: Once | ORAL | Status: AC
  Administered 2013-04-20: 5 mg via ORAL
  Filled 2013-04-20: qty 1

## 2013-04-20 MED ORDER — CYCLOBENZAPRINE HCL 10 MG PO TABS: 10.0000 mg | ORAL_TABLET | Freq: Two times a day (BID) | ORAL | Status: DC | PRN

## 2013-04-20 MED ORDER — IBUPROFEN 600 MG PO TABS: 600.0000 mg | ORAL_TABLET | Freq: Four times a day (QID) | ORAL | Status: DC | PRN
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
## 2013-04-20 NOTE — ED Notes (Signed)
Patient transported to X-ray 

## 2013-04-24 NOTE — ED Provider Notes (Signed)
Medical screening examination/treatment/procedure(s) were performed by non-physician practitioner and as supervising physician I was immediately available for consultation/collaboration.   Shelda Jakes, MD 04/24/13 1037

## 2013-08-18 ENCOUNTER — Encounter (HOSPITAL_COMMUNITY): Payer: Self-pay | Admitting: Emergency Medicine

## 2013-08-18 ENCOUNTER — Emergency Department (INDEPENDENT_AMBULATORY_CARE_PROVIDER_SITE_OTHER)
Admission: EM | Admit: 2013-08-18 | Discharge: 2013-08-18 | Disposition: A | Discharge status: Home / Self Care | Payer: 59 | Source: Home / Self Care | Attending: Emergency Medicine | Admitting: Emergency Medicine

## 2013-08-18 DIAGNOSIS — J039 Acute tonsillitis, unspecified: Secondary | ICD-10-CM

## 2013-08-18 LAB — POCT INFECTIOUS MONO SCREEN: Mono Screen: NEGATIVE

## 2013-08-18 MED ORDER — ACETAMINOPHEN 325 MG PO TABS
ORAL_TABLET | ORAL | Status: AC
  Filled 2013-08-18: qty 2

## 2013-08-18 MED ORDER — AMOXICILLIN 500 MG PO CAPS: 500.0000 mg | ORAL_CAPSULE | Freq: Three times a day (TID) | ORAL | Status: DC

## 2013-08-18 MED ORDER — NAPROXEN 500 MG PO TABS: 500.0000 mg | ORAL_TABLET | Freq: Two times a day (BID) | ORAL | Status: DC

## 2013-08-18 MED ORDER — ACETAMINOPHEN 325 MG PO TABS
650.0000 mg | ORAL_TABLET | Freq: Once | ORAL | Status: AC
  Administered 2013-08-18: 650 mg via ORAL

## 2013-08-18 NOTE — ED Notes (Signed)
Pt  Reports  Symptoms  Of  sorethroat   And  Bilateral  Earache      Since  Yesterday     She  Is  Sitting upright on the  Exam table  Speaking in  Complete  sentances  And  Is  In no  Acute  Distress

## 2013-08-18 NOTE — ED Provider Notes (Signed)
Chief Complaint:   Chief Complaint  Patient presents with  . Sore Throat    History of Present Illness:   [name] is a 32 year old female who has had a two-day history of sore throat, pain on swallowing, earache, chills, headache, swollen glands. She denies any fever, nasal congestion, rhinorrhea, red eyes, cough, or GI symptoms. There has been no known exposure to strep or mono. No prior history of strep or mono.  Review of Systems:  Other than as noted above, the patient denies any of the following symptoms. Systemic:  No fever, chills, sweats, myalgias, or headache. Eye:  No redness, pain or drainage. ENT:  No earache, nasal congestion, sneezing, rhinorrhea, sinus pressure, sinus pain, or post nasal drip. Lungs:  No cough, sputum production, wheezing, shortness of breath, or chest pain. GI:  No abdominal pain, nausea, vomiting, or diarrhea. Skin:  No rash.  PMFSH:  Past medical history, family history, social history, meds, allergies, and nurse's notes were reviewed.  There is no known exposure to strep or mono.  No prior history of step or mono.  The patient denies use of tobacco.   Physical Exam:   Vital signs:  BP 125/92  Pulse 70  Temp(Src) 98.6 F (37 C) (Oral)  Resp 20  SpO2 100% General:  Alert, in no distress. Phonation was normal, no drooling, and patient was able to handle secretions well.  Eye:  No conjunctival injection or drainage. Lids were normal. ENT:  TMs and canals were normal, without erythema or inflammation.  Nasal mucosa was clear and uncongested, without drainage.  Mucous membranes were moist.  Exam of pharynx tonsils were mildly enlarged with spots of white exudate.  There were no oral ulcerations or lesions. There was no bulging of the tonsillar pillars, and the uvula was midline. Neck:  Supple, no adenopathy, tenderness or mass. Lungs:  No respiratory distress.  Lungs were clear to auscultation, without wheezes, rales or rhonchi.  Breath sounds were  clear and equal bilaterally.  Heart:  Regular rhythm, without gallops, murmers or rubs. Skin:  Clear, warm, and dry, without rash or lesions.  Labs:   Results for orders placed during the hospital encounter of 08/18/13  POCT RAPID STREP A ([facility]ARE ONLY)      Result Value Range   Streptococcus, Group A Screen (Direct) NEGATIVE  NEGATIVE  POCT INFECTIOUS MONO SCREEN      Result Value Range   Mono Screen NEGATIVE  NEGATIVE   Assessment:  The encounter diagnosis was Tonsillitis.  There is no evidence of a peritonsillar abscess.   Plan:   1.  Meds:  The following meds were prescribed:   New Prescriptions   AMOXICILLIN (AMOXIL) 500 MG CAPSULE    Take 1 capsule (500 mg total) by mouth 3 (three) times daily.   NAPROXEN (NAPROSYN) 500 MG TABLET    Take 1 tablet (500 mg total) by mouth 2 (two) times daily.    2.  Patient Education/Counseling:  The patient was given appropriate handouts, self care instructions, and instructed in symptomatic relief, including hot saline gargles, throat lozenges, infectious precautions, and need to trade out toothbrush.   3.  Follow up:  The patient was told to follow up if no better in 3 to 4 days, or sooner if becoming worse in any way, and given some red flag symptoms such as difficulty swallowing or breathing which would prompt immediate return.  Follow up here as necessary.     Felicia Kid  Parrish Coaster, MD 08/18/13 1036

## 2013-08-20 LAB — CULTURE, GROUP A STREP

## 2014-02-08 ENCOUNTER — Emergency Department (HOSPITAL_COMMUNITY)
Admission: EM | Admit: 2014-02-08 | Discharge: 2014-02-08 | Discharge status: Against Medical Advice | Payer: 59 | Attending: Emergency Medicine | Admitting: Emergency Medicine

## 2014-02-08 ENCOUNTER — Encounter (HOSPITAL_COMMUNITY): Payer: Self-pay | Admitting: Emergency Medicine

## 2014-02-08 DIAGNOSIS — M549 Dorsalgia, unspecified: Secondary | ICD-10-CM | POA: Insufficient documentation

## 2014-02-08 DIAGNOSIS — R209 Unspecified disturbances of skin sensation: Secondary | ICD-10-CM | POA: Insufficient documentation

## 2014-02-08 DIAGNOSIS — M79609 Pain in unspecified limb: Secondary | ICD-10-CM | POA: Insufficient documentation

## 2014-02-08 DIAGNOSIS — M542 Cervicalgia: Secondary | ICD-10-CM | POA: Insufficient documentation

## 2014-02-08 NOTE — ED Notes (Signed)
Pt came to desk and stated that she could not wait any longer, and that 3 hours was long enough. Attempted to convince pt to stay without success

## 2014-02-08 NOTE — ED Notes (Signed)
Pt report neck, back and R arm pain ongoing for 2 weeks with numbness on and off to R arm as well. No injury noted. Pms intact.

## 2014-02-08 NOTE — ED Notes (Signed)
Pt updated on wait and made aware of plan to get X-ray of her cervical spine

## 2014-02-17 ENCOUNTER — Emergency Department (HOSPITAL_COMMUNITY)
Admission: EM | Admit: 2014-02-17 | Discharge: 2014-02-17 | Disposition: A | Discharge status: Home / Self Care | Payer: 59 | Attending: Emergency Medicine | Admitting: Emergency Medicine

## 2014-02-17 ENCOUNTER — Encounter (HOSPITAL_COMMUNITY): Payer: Self-pay | Admitting: Emergency Medicine

## 2014-02-17 DIAGNOSIS — Z8701 Personal history of pneumonia (recurrent): Secondary | ICD-10-CM | POA: Insufficient documentation

## 2014-02-17 DIAGNOSIS — M549 Dorsalgia, unspecified: Secondary | ICD-10-CM | POA: Insufficient documentation

## 2014-02-17 DIAGNOSIS — Z79899 Other long term (current) drug therapy: Secondary | ICD-10-CM | POA: Insufficient documentation

## 2014-02-17 DIAGNOSIS — M542 Cervicalgia: Secondary | ICD-10-CM

## 2014-02-17 DIAGNOSIS — E669 Obesity, unspecified: Secondary | ICD-10-CM | POA: Insufficient documentation

## 2014-02-17 MED ORDER — PREDNISONE 20 MG PO TABS: ORAL_TABLET | ORAL | Status: DC

## 2014-02-17 NOTE — Discharge Instructions (Signed)
Musculoskeletal Pain °Musculoskeletal pain is muscle and boney aches and pains. These pains can occur in any part of the body. Your caregiver may treat you without knowing the cause of the pain. They may treat you if blood or urine tests, X-rays, and other tests were normal.  °CAUSES °There is often not a definite cause or reason for these pains. These pains may be caused by a type of germ (virus). The discomfort may also come from overuse. Overuse includes working out too hard when your body is not fit. Boney aches also come from weather changes. Bone is sensitive to atmospheric pressure changes. °HOME CARE INSTRUCTIONS  °· Ask when your test results will be ready. Make sure you get your test results. °· Only take over-the-counter or prescription medicines for pain, discomfort, or fever as directed by your caregiver. If you were given medications for your condition, do not drive, operate machinery or power tools, or sign legal documents for 24 hours. Do not drink alcohol. Do not take sleeping pills or other medications that may interfere with treatment. °· Continue all activities unless the activities cause more pain. When the pain lessens, slowly resume normal activities. Gradually increase the intensity and duration of the activities or exercise. °· During periods of severe pain, bed rest may be helpful. Lay or sit in any position that is comfortable. °· Putting ice on the injured area. °· Put ice in a bag. °· Place a towel between your skin and the bag. °· Leave the ice on for 15 to 20 minutes, 3 to 4 times a day. °· Follow up with your caregiver for continued problems and no reason can be found for the pain. If the pain becomes worse or does not go away, it may be necessary to repeat tests or do additional testing. Your caregiver may need to look further for a possible cause. °SEEK IMMEDIATE MEDICAL CARE IF: °· You have pain that is getting worse and is not relieved by medications. °· You develop chest pain  that is associated with shortness or breath, sweating, feeling sick to your stomach (nauseous), or throw up (vomit). °· Your pain becomes localized to the abdomen. °· You develop any new symptoms that seem different or that concern you. °MAKE SURE YOU:  °· Understand these instructions. °· Will watch your condition. °· Will get help right away if you are not doing well or get worse. °Document Released: 09/02/2005 Document Revised: 11/25/2011 Document Reviewed: 05/07/2013 °[employer]® Patient Information ©2014 [employer]. ° °
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
## 2014-02-17 NOTE — ED Notes (Signed)
Per pt, neck pain and arm pain to right side for 3 weeks.  No trauma noted.  Pt states some numbness to arm.  No chest pain, no jaw pain.  No fever.

## 2014-02-17 NOTE — ED Notes (Signed)
Pt has spoken to her primary md over phone and given script for 800mg  ibuprofen.  Has follow up appt.  June 22nd.  Wants to be seen sooner than that regarding pain.

## 2014-02-17 NOTE — ED Provider Notes (Signed)
Medical screening examination/treatment/procedure(s) were performed by non-physician practitioner and as supervising physician I was immediately available for consultation/collaboration.   EKG Interpretation None        Osvaldo Shipper, MD 02/17/14 [phone]

## 2014-03-01 ENCOUNTER — Emergency Department (HOSPITAL_COMMUNITY)
Admission: EM | Admit: 2014-03-01 | Discharge: 2014-03-01 | Disposition: A | Discharge status: Home / Self Care | Payer: 59 | Attending: Emergency Medicine | Admitting: Emergency Medicine

## 2014-03-01 ENCOUNTER — Encounter (HOSPITAL_COMMUNITY): Payer: Self-pay | Admitting: Emergency Medicine

## 2014-03-01 ENCOUNTER — Emergency Department (HOSPITAL_COMMUNITY): Payer: 59

## 2014-03-01 DIAGNOSIS — R55 Syncope and collapse: Secondary | ICD-10-CM | POA: Insufficient documentation

## 2014-03-01 DIAGNOSIS — Z8701 Personal history of pneumonia (recurrent): Secondary | ICD-10-CM | POA: Insufficient documentation

## 2014-03-01 DIAGNOSIS — R519 Headache, unspecified: Secondary | ICD-10-CM

## 2014-03-01 DIAGNOSIS — R42 Dizziness and giddiness: Secondary | ICD-10-CM | POA: Insufficient documentation

## 2014-03-01 DIAGNOSIS — R51 Headache: Secondary | ICD-10-CM | POA: Insufficient documentation

## 2014-03-01 DIAGNOSIS — Z3202 Encounter for pregnancy test, result negative: Secondary | ICD-10-CM | POA: Insufficient documentation

## 2014-03-01 DIAGNOSIS — E669 Obesity, unspecified: Secondary | ICD-10-CM | POA: Insufficient documentation

## 2014-03-01 LAB — URINALYSIS, ROUTINE W REFLEX MICROSCOPIC
Bilirubin Urine: NEGATIVE
Glucose, UA: NEGATIVE mg/dL
Hgb urine dipstick: NEGATIVE
KETONES UR: NEGATIVE mg/dL
Nitrite: NEGATIVE
PH: 6 (ref 5.0–8.0)
PROTEIN: NEGATIVE mg/dL
Specific Gravity, Urine: 1.02 (ref 1.005–1.030)
UROBILINOGEN UA: 2 mg/dL — AB (ref 0.0–1.0)

## 2014-03-01 LAB — COMPREHENSIVE METABOLIC PANEL
ALT: 15 U/L (ref 0–35)
AST: 16 U/L (ref 0–37)
Albumin: 3.5 g/dL (ref 3.5–5.2)
Alkaline Phosphatase: 58 U/L (ref 39–117)
BILIRUBIN TOTAL: 0.4 mg/dL (ref 0.3–1.2)
BUN: 7 mg/dL (ref 6–23)
CO2: 24 mEq/L (ref 19–32)
Calcium: 9.2 mg/dL (ref 8.4–10.5)
Chloride: 101 mEq/L (ref 96–112)
Creatinine, Ser: 0.69 mg/dL (ref 0.50–1.10)
GFR calc non Af Amer: >90 mL/min (ref >90)
Glucose, Bld: 84 mg/dL (ref 70–99)
POTASSIUM: 4 meq/L (ref 3.7–5.3)
Sodium: 139 mEq/L (ref 137–147)
TOTAL PROTEIN: 8 g/dL (ref 6.0–8.3)

## 2014-03-01 LAB — CBC WITH DIFFERENTIAL/PLATELET
Basophils Absolute: 0 10*3/uL (ref 0.0–0.1)
Basophils Relative: 1 % (ref 0–1)
EOS ABS: 0.1 10*3/uL (ref 0.0–0.7)
EOS PCT: 2 % (ref 0–5)
HCT: 35.7 % — ABNORMAL LOW (ref 36.0–46.0)
Hemoglobin: 11.9 g/dL — ABNORMAL LOW (ref 12.0–15.0)
LYMPHS ABS: 2.3 10*3/uL (ref 0.7–4.0)
Lymphocytes Relative: 38 % (ref 12–46)
MCH: 26.6 pg (ref 26.0–34.0)
MCHC: 33.3 g/dL (ref 30.0–36.0)
MCV: 79.9 fL (ref 78.0–100.0)
Monocytes Absolute: 0.5 10*3/uL (ref 0.1–1.0)
Monocytes Relative: 8 % (ref 3–12)
NEUTROS PCT: 51 % (ref 43–77)
Neutro Abs: 3.1 10*3/uL (ref 1.7–7.7)
Platelets: 399 10*3/uL (ref 150–400)
RBC: 4.47 MIL/uL (ref 3.87–5.11)
RDW: 12.8 % (ref 11.5–15.5)
WBC: 6.1 10*3/uL (ref 4.0–10.5)

## 2014-03-01 LAB — URINE MICROSCOPIC-ADD ON

## 2014-03-01 LAB — PREGNANCY, URINE: Preg Test, Ur: NEGATIVE

## 2014-03-01 MED ORDER — MECLIZINE HCL 50 MG PO TABS: 25.0000 mg | ORAL_TABLET | Freq: Three times a day (TID) | ORAL | Status: DC | PRN

## 2014-03-01 MED ORDER — SODIUM CHLORIDE 0.9 % IV BOLUS (SEPSIS)
1000.0000 mL | Freq: Once | INTRAVENOUS | Status: AC
  Administered 2014-03-01: 1000 mL via INTRAVENOUS

## 2014-03-01 MED ORDER — MECLIZINE HCL 25 MG PO TABS
25.0000 mg | ORAL_TABLET | Freq: Once | ORAL | Status: AC
  Administered 2014-03-01: 25 mg via ORAL
  Filled 2014-03-01: qty 1

## 2014-03-01 NOTE — ED Notes (Signed)
Per pt sts she has been dizzy since Friday. sts also some stomach cramping and N,V. sts today she has a HA.

## 2014-03-01 NOTE — Discharge Instructions (Signed)
Read the information below.  Use the prescribed medication as directed.  Please discuss all new medications with your pharmacist.  You may return to the Emergency Department at any time for worsening condition or any new symptoms that concern you.    Your exam shows you have had an episode of vertigo, which causes a false sense of movement such as a spinning feeling or walls that seem to move.  Most vertigo is caused by a (usually temporary) problem in the inner ear. Rarely, the back part of the brain can cause vertigo (some mini-strokes / TIA's / strokes), but it appears to be a low risk cause for you at this time. It is important to follow-up with your doctor however, to see if you need further testing.  Do not drive or participate in potentially dangerous activities requiring balance unless off meds (not drowsy) and the vertigo has resolved. Most of the time benign vertigo is much better after a few days. However, mild unsteadiness may last for up to 3 months in some patients. An MRI scan or other special tests to evaluate your hearing and balance may be needed if the vertigo does not improve or returns in the future. RETURN IMMEDIATELY IF YOU HAVE ANY OF THE FOLLOWING (call 911): Increasing vertigo, earache, ear drainage, or loss of hearing.  Severe headache, blurred or double vision, or trouble walking.  Fainting or poorly responsive, extreme weakness, chest pain, or palpitations.  Fever, persistent vomiting, or dehydration.  Numbness, tingling, incoordination, or weakness of the limbs.  Change in speech, vision, swallowing, understanding, or other concerns.   Dizziness Dizziness is a common problem. It is a feeling of unsteadiness or lightheadedness. You may feel like you are about to faint. Dizziness can lead to injury if you stumble or fall. A person of any age group can suffer from dizziness, but dizziness is more common in older adults. CAUSES  Dizziness can be caused by many different  things, including:  Middle ear problems.  Standing for too long.  Infections.  An allergic reaction.  Aging.  An emotional response to something, such as the sight of blood.  Side effects of medicines.  Fatigue.  Problems with circulation or blood pressure.  Excess use of alcohol, medicines, or illegal drug use.  Breathing too fast (hyperventilation).  An arrhythmia or problems with your heart rhythm.  Low red blood cell count (anemia).  Pregnancy.  Vomiting, diarrhea, fever, or other illnesses that cause dehydration.  Diseases or conditions such as Parkinson's disease, high blood pressure (hypertension), diabetes, and thyroid problems.  Exposure to extreme heat. DIAGNOSIS  To find the cause of your dizziness, your caregiver may do a physical exam, lab tests, radiologic imaging scans, or an electrocardiography test (ECG).  TREATMENT  Treatment of dizziness depends on the cause of your symptoms and can vary greatly. HOME CARE INSTRUCTIONS   Drink enough fluids to keep your urine clear or pale yellow. This is especially important in very hot weather. In the elderly, it is also important in cold weather.  If your dizziness is caused by medicines, take them exactly as directed. When taking blood pressure medicines, it is especially important to get up slowly.  Rise slowly from chairs and steady yourself until you feel okay.  In the morning, first sit up on the side of the bed. When this seems okay, stand slowly while holding onto something until you know your balance is fine.  If you need to stand in one place for  a long time, be sure to move your legs often. Tighten and relax the muscles in your legs while standing.  If dizziness continues to be a problem, have someone stay with you for a day or two. Do this until you feel you are well enough to stay alone. Have the person call your caregiver if he or she notices changes in you that are concerning.  Do not drive or  use heavy machinery if you feel dizzy.  Do not drink alcohol. SEEK IMMEDIATE MEDICAL CARE IF:   Your dizziness or lightheadedness gets worse.  You feel nauseous or vomit.  You develop problems with talking, walking, weakness, or using your arms, hands, or legs.  You are not thinking clearly or you have difficulty forming sentences. It may take a friend or family member to determine if your thinking is normal.  You develop chest pain, abdominal pain, shortness of breath, or sweating.  Your vision changes.  You notice any bleeding.  You have side effects from medicine that seems to be getting worse rather than better. MAKE SURE YOU:   Understand these instructions.  Will watch your condition.  Will get help right away if you are not doing well or get worse. Document Released: 02/26/2001 Document Revised: 11/25/2011 Document Reviewed: 03/22/2011 [facility] Patient Information 2014 [place], [place].  Syncope Syncope is a fainting spell. This means the person loses consciousness and drops to the ground. The person is generally unconscious for less than 5 minutes. The person may have some muscle twitches for up to 15 seconds before waking up and returning to normal. Syncope occurs more often in elderly people, but it can happen to anyone. While most causes of syncope are not dangerous, syncope can be a sign of a serious medical problem. It is important to seek medical care.  CAUSES  Syncope is caused by a sudden decrease in blood flow to the brain. The specific cause is often not determined. Factors that can trigger syncope include:  Taking medicines that lower blood pressure.  Sudden changes in posture, such as standing up suddenly.  Taking more medicine than prescribed.  Standing in one place for too long.  Seizure disorders.  Dehydration and excessive exposure to heat.  Low blood sugar (hypoglycemia).  Straining to have a bowel movement.  Heart disease, irregular  heartbeat, or other circulatory problems.  Fear, emotional distress, seeing blood, or severe pain. SYMPTOMS  Right before fainting, you may:  Feel dizzy or lightheaded.  Feel nauseous.  See all white or all black in your field of vision.  Have cold, clammy skin. DIAGNOSIS  Your caregiver will ask about your symptoms, perform a physical exam, and perform electrocardiography (ECG) to record the electrical activity of your heart. Your caregiver may also perform other heart or blood tests to determine the cause of your syncope. TREATMENT  In most cases, no treatment is needed. Depending on the cause of your syncope, your caregiver may recommend changing or stopping some of your medicines. HOME CARE INSTRUCTIONS  Have someone stay with you until you feel stable.  Do not drive, operate machinery, or play sports until your caregiver says it is okay.  Keep all follow-up appointments as directed by your caregiver.  Lie down right away if you start feeling like you might faint. Breathe deeply and steadily. Wait until all the symptoms have passed.  Drink enough fluids to keep your urine clear or pale yellow.  If you are taking blood pressure or heart medicine, get up  slowly, taking several minutes to sit and then stand. This can reduce dizziness. SEEK IMMEDIATE MEDICAL CARE IF:   You have a severe headache.  You have unusual pain in the chest, abdomen, or back.  You are bleeding from the mouth or rectum, or you have black or tarry stool.  You have an irregular or very fast heartbeat.  You have pain with breathing.  You have repeated fainting or seizure-like jerking during an episode.  You faint when sitting or lying down.  You have confusion.  You have difficulty walking.  You have severe weakness.  You have vision problems. If you fainted, call your local emergency services (911 in [place] Do not drive yourself to the hospital.  MAKE SURE YOU:  Understand these  instructions.  Will watch your condition.  Will get help right away if you are not doing well or get worse. Document Released: 09/02/2005 Document Revised: 03/03/2012 Document Reviewed: 11/01/2011 [facility] Patient Information 2014 [place].   Emergency Department Resource Guide 1) Find a Doctor and Pay Out of Pocket Although you won't have to find out who is covered by your insurance plan, it is a good idea to ask around and get recommendations. You will then need to call the office and see if the doctor you have chosen will accept you as a new patient and what types of options they offer for patients who are self-pay. Some doctors offer discounts or will set up payment plans for their patients who do not have insurance, but you will need to ask so you aren't surprised when you get to your appointment.  2) Contact Your Local Health Department Not all health departments have doctors that can see patients for sick visits, but many do, so it is worth a call to see if yours does. If you don't know where your local health department is, you can check in your phone book. The CDC also has a tool to help you locate your state's health department, and many state websites also have listings of all of their local health departments.  3) Find a [facility] If your illness is not likely to be very severe or complicated, you may want to try a walk in clinic. These are popping up all over the country in pharmacies, drugstores, and shopping centers. They're usually staffed by nurse practitioners or physician assistants that have been trained to treat common illnesses and complaints. They're usually fairly quick and inexpensive. However, if you have serious medical issues or chronic medical problems, these are probably not your best option.  No Primary Care Doctor: - Call Health Connect at  [phone] - they can help you locate a primary care doctor that  accepts your insurance, provides certain services,  etc. - Physician Referral Service- [phone]  Chronic Pain Problems: Organization         Address  Phone   Notes  [facility]  [phone] Patients need to be referred by their primary care doctor.   Medication Assistance: Organization         Address  Phone   Notes  [facility] Medication [facility] [address] [place], [place] [address] [phone] --Must be a resident of [facility] -- Must have NO insurance coverage whatsoever (no Medicaid/ Medicare, etc.) -- The pt. MUST have a primary care doctor that directs their care regularly and follows them in the community   MedAssist  [phone]   [employer]  [phone])  [phone]    Agencies that provide inexpensive medical care: Organization         Address  Phone   Notes  [place]  [phone]   [facility]    [phone]   [facility] [place], [place] [address] [phone]   [place] [address] [place] [phone]   Planned Parenthood    [phone]   [facility]    [phone]   [place] [address], [place] Phone:  [phone], Fax:  [phone] Hours of Operation:  9 am - 6 pm, M-F.  Also accepts Medicaid/Medicare and self-pay.  [facility] for [place] [address], [address], [place] Phone: [phone], Fax: [phone]. Hours of Operation:  8:30 am - 5:30 pm, M-F.  Also accepts Medicaid and self-pay.  [facility] [address], [place] Phone: [phone]   [place], [place], [place] [phone], Ext. [phone] Mondays & Thursdays: 7-9 AM.  First 15 patients are seen on a first come, first serve basis.    [place] Providers:  Organization         Address  Phone   Notes  [facility] [address], [place] [phone] Also accepts self-pay patients.  [facility] [address], [place]  [phone]   [place], [address], [place] [phone]   [facility] [address] [place] [phone]   Lucianne Lei [address], [place]   [phone] Only accepts [place] Access [place] patients after they have their name applied to their card.   Self-Pay (no insurance) in [facility]:  Organization         Address  Phone   Notes  Sickle Cell Patients, [facility] [place] [phone]   [facility] [place] [phone]   [facility] [place]  [place], [place], [place] [phone]   [facility]/Dr. Osei-Bonsu  [address] [place] or [address], [place] [phone] Phone number for both [place] and [place] locations is the same.  Urgent Medical and [facility] [address], [place] [phone]   [facility] [address], [place] or [address] [phone]   [facility] [address], [place] [phone], phone; [phone], fax Sees patients 1st and 3rd Saturday of every month.  Must not qualify for public or private insurance (i.e. Medicaid, Medicare, [place] Health Choice, Veterans' Benefits)  Household income should be no more than 200% of the poverty level The clinic cannot treat you if you are pregnant or think you are pregnant  Sexually transmitted diseases are not treated at the clinic.    Dental Care: Organization         Address  Phone  Notes  [facility] Department of [facility] [phone] Accepts children up to  age 38 who are enrolled in [place] or [place]; pregnant women with a Medicaid card; and children who have  applied for Medicaid or [place] Health Choice, but were declined, whose parents can pay a reduced fee at time of service.  [facility] Department of [facility]  [address], [place] [phone] Accepts children up to age 42 who are enrolled in [place] or [place]; pregnant women with a Medicaid card; and children who have applied for Medicaid or [place] Health Choice, but were declined, whose parents can pay a reduced fee at time of service.  [place] Adult [facility] Access PROGRAM  [place] [phone] Patients are seen by appointment only. Walk-ins are not accepted. [place] will see patients 55 years of age and older. Monday - Tuesday (8am-5pm) Most Wednesdays (8:30-5pm) $30 per visit, cash only  [facility] Adult [facility] Access PROGRAM  [address], [facility] [phone] Patients are seen by appointment only. Walk-ins are not accepted. [place] will see patients 77 years of age and older. One Wednesday Evening (Monthly: Volunteer Based).  $30 per visit, cash only  [place]  [phone] for adults; Children under age 18, call Graduate Pediatric Dentistry at [phone]. Children aged 64-14, please call [phone] to request a pediatric application.  Dental services are provided in all areas of dental care including fillings, crowns and bridges, complete and partial dentures, implants, gum treatment, root canals, and extractions. Preventive care is also provided. Treatment is provided to both adults and children. Patients are selected via a lottery and there is often a waiting list.   [facility] [address] [place]  [phone] [email or web address]   [facility] [address] [place], [place] [phone], Ext. [phone] Second and Fourth Thursday of  each month, opens at 6:30 AM; Clinic ends at 9 AM.  Patients are seen on a first-come first-served basis, and a limited number are seen during each clinic.   [facility]  [address] Hillard Danker [place], [place] [phone]   Eligibility Requirements You must have lived in [place], [place], or [place] counties for at least the last three months.   You cannot be eligible for state or federal sponsored [employer], including [employer], [place], or [employer].   You generally cannot be eligible for healthcare insurance through your employer.    How to apply: Eligibility screenings are held every Tuesday and Wednesday afternoon from 1:00 pm until 4:00 pm. You do not need an appointment for the interview!  [facility] [address] [place], [place]   [place]  [place] Department  [place]  [phone]    Behavioral Health Resources in the Community: Intensive Outpatient Programs Organization         Address  Phone  Notes  [place]. [address] [place], [place] [phone]   [facility] [address], [place], [place]   ADS: Alcohol & Drug Svcs [address] [place], [place]   [place] [address],  [place], [place] or [phone]   Substance Abuse Resources Organization         Address  Phone  Notes  Alcohol and Drug Services  [place]  [phone]   [place]   Chinita Pester  [phone]   Residential & Outpatient Substance Abuse  Program  [phone]   Psychological Services Organization         Address  Phone  Notes  [place]  [place]  [phone]   [place] [address]  [place] or [phone]    Mobile Crisis Teams Organization         Address  Phone  Notes  [facility], Mobile Crisis Care Unit  [phone]   [facility]  [address] [place], [place]   [place] [address], [place] [phone]    Self-Help/Support Groups Organization         Address  Phone             Notes  [place]. of [place] - variety of support groups  [place] Call for more information  Narcotics Anonymous (NA), [facility] [address], [employer] [place]  2 meetings at this location   Special educational needs teacher         Address  Phone  Notes  [facility] [place],    [place]  [phone]   [facility]  [address] [place] [phone], [place], [place]   [place], [place] [phone] Admissions: 8am-3pm M-F  Incentives Substance [place] [address]    [place], [place] [phone]   [facility] [address] [place], [place], [place]   [facility] [address],  [place], [place]   [facility] - Intensive Outpatient [address], [place], [place]   [facility] ([place].,  [place], [place] [phone] or [phone]   [facility] ([facility]) [address] [place], [place] Accepts Medicaid  [facility] [place] [address]  [place] [phone] Substance Abuse/Addiction Treatment   [facility] Organization         Address  Phone  Notes  [facility]  [phone]   Domenic Schwab, PhD [address] Arlis Porta [place], [place]   [phone] or [phone]   [place], [place] [phone]     [facility] [address], [place], [place] [phone] Insurance/Medicaid/sponsorship through [facility] and Families [address] [place]                                    [place], [place] [phone] [place] [address], [place] [phone]    Dr. Adele Schilder  [phone]   [facility] Dept. 1) [address], [place] 2) [place] 3)  [address], [place] [phone]  [phone]   [place] [phone] or [phone] (After Hours)

## 2014-03-01 NOTE — ED Provider Notes (Signed)
CSN: [id number]     Arrival date & time 03/01/14  1045 History   First MD Initiated Contact with Patient 03/01/14 1115     Chief Complaint  Patient presents with  . Dizziness     (Consider location/radiation/quality/duration/timing/severity/associated sxs/prior Treatment) The history is provided by the patient.    Patient presents with 5 days of dizziness (spinning) and lightheadedness, followed by syncope today while sitting in the car conversing with her family.  The dizziness was preceded by several hours up epigastric cramping, and one episode of nausea and vomiting.  Pt has had 10/10 headache over the top of her head and occiput following the syncopal episode.  Has had anorexia x 4 days.  Has not had any abdominal pain or N/V since the one episode 4 days ago.  Denies weakness or numbness of the extremities, visual changes.   Denies fevers, recent illness, CP, SOB.   Past Medical History  Diagnosis Date  . Pneumonia 2007  . Obesity    Past Surgical History  Procedure Laterality Date  . Cesarean section    . Intrauterine device insertion  02/2012    Mirena   Family History  Problem Relation Age of Onset  . Diabetes Mother   . Hypertension Mother   . Breast cancer Mother 49  . Breast cancer Maternal Grandmother 46  . Heart disease Neg Hx   . Stroke Neg Hx    History  Substance Use Topics  . Smoking status: Never Smoker   . Smokeless tobacco: Never Used  . Alcohol Use: No   OB History   Grav Para Term Preterm Abortions TAB SAB Ect Mult Living   3 2 2  1 1    2      Review of Systems  All other systems reviewed and are negative.     Allergies  Review of patient's allergies indicates no known allergies.  Home Medications   Prior to Admission medications   Medication Sig Start Date End Date Taking? Authorizing Provider  dimenhyDRINATE (DRAMAMINE) 50 MG tablet Take 50 mg by mouth every 8 (eight) hours as needed for dizziness.   Yes Historical Provider, MD   levonorgestrel (MIRENA) 20 MCG/24HR IUD 1 each by Intrauterine route once.   Yes Historical Provider, MD   BP 138/86  Pulse 83  Temp(Src) 97.6 F (36.4 C) (Oral)  Resp 18  SpO2 100%  LMP 02/02/2014 Physical Exam  Nursing note and vitals reviewed. Constitutional: She appears well-developed and well-nourished. No distress.  HENT:  Head: Normocephalic and atraumatic.  Mouth/Throat: Oropharynx is clear and moist.  Eyes: Conjunctivae are normal.  Neck: Normal range of motion. Neck supple.  Cardiovascular: Normal rate and regular rhythm.   Pulmonary/Chest: Effort normal and breath sounds normal. No respiratory distress. She has no wheezes. She has no rales.  Abdominal: Soft. She exhibits no distension. There is no tenderness. There is no rebound and no guarding.  obese  Neurological: She is alert.  CN II-XII intact, EOMs intact, no pronator drift, grip strengths equal bilaterally; strength 5/5 in all extremities, sensation intact in all extremities; finger to nose, heel to shin, rapid alternating movements normal. Gait testing deferred as pt is off balance/dizzy.     Skin: She is not diaphoretic.  Psychiatric: She has a normal mood and affect. Her behavior is normal. Thought content normal.    ED Course  Procedures (including critical care time) Labs Review Labs Reviewed  CBC WITH DIFFERENTIAL - Abnormal; Notable for the following:  Hemoglobin 11.9 (*)    HCT 35.7 (*)    All other components within normal limits  URINALYSIS, ROUTINE W REFLEX MICROSCOPIC - Abnormal; Notable for the following:    APPearance CLOUDY (*)    Urobilinogen, UA 2.0 (*)    Leukocytes, UA TRACE (*)    All other components within normal limits  URINE MICROSCOPIC-ADD ON - Abnormal; Notable for the following:    Squamous Epithelial / LPF FEW (*)    All other components within normal limits  COMPREHENSIVE METABOLIC PANEL  PREGNANCY, URINE    Imaging Review Ct Head Wo Contrast  03/01/2014   CLINICAL  DATA:  Dizziness, off balance, syncopal episode, and headache.  EXAM: CT HEAD WITHOUT CONTRAST  TECHNIQUE: Contiguous axial images were obtained from the base of the skull through the vertex without intravenous contrast.  COMPARISON:  None.  FINDINGS: The ventricles and sulci are within normal limits for age. There is no evidence of acute infarct, intracranial hemorrhage, mass, midline shift, or extra-axial collection. The orbits are unremarkable. The visualized paranasal sinuses and mastoid air cells are clear. There is no evidence of acute fracture.  IMPRESSION: Unremarkable head CT.   Electronically Signed   By: Logan Bores   On: 03/01/2014 12:19     EKG Interpretation None      12:02 PM Discussed pt and workup with Dr Regenia Skeeter.    1:19 PM Pt reports she is feeling much better.  Headache and dizziness have resolved.  Ambulated in presence of Dr Regenia Skeeter without difficulty.    Date: 03/01/2014  Rate: 85  Rhythm: normal sinus rhythm  QRS Axis: left  Intervals: normal  ST/T Wave abnormalities: nonspecific T wave changes  Conduction Disutrbances:none  Narrative Interpretation:   Old EKG Reviewed: none available    MDM   Final diagnoses:  Syncope  Dizziness  Headache    Pt with dizziness/lightheadedness x 4 days, episode of syncope today followed by headache.  Labs, CT head, EKG unremarkable.  Neuro exam is normal.  Dizziness worse with moving eyes.  Pt's headache and dizziness completely relieved with IVF and meclizine.  Also seen and examined by Dr Regenia Skeeter.  D/C home with meclizine.  Encouraged close PCP follow up.  Discussed result, findings, treatment, and follow up  with patient.  Pt given return precautions.  Pt verbalizes understanding and agrees with plan.        Clayton Bibles, PA-C 03/01/14 1454

## 2014-03-02 NOTE — ED Provider Notes (Signed)
Medical screening examination/treatment/procedure(s) were conducted as a shared visit with non-physician practitioner(s) and myself.  I personally evaluated the patient during the encounter.   EKG Interpretation None       Patient's syncope most likely dizziness related. Doubt SAH with negative CT within 3 hours of headache onset, and has normal neuro exam, including normal gait. Likely vertigo given her room spinning sensation and clearance with meclizine. Stable for discharge.  Ephraim Hamburger, MD 03/02/14 [phone]

## 2014-03-31 ENCOUNTER — Emergency Department (HOSPITAL_COMMUNITY): Payer: 59

## 2014-03-31 ENCOUNTER — Emergency Department (HOSPITAL_COMMUNITY)
Admission: EM | Admit: 2014-03-31 | Discharge: 2014-03-31 | Disposition: A | Discharge status: Home / Self Care | Payer: 59 | Attending: Emergency Medicine | Admitting: Emergency Medicine

## 2014-03-31 ENCOUNTER — Encounter (HOSPITAL_COMMUNITY): Payer: Self-pay | Admitting: Emergency Medicine

## 2014-03-31 DIAGNOSIS — Z3202 Encounter for pregnancy test, result negative: Secondary | ICD-10-CM | POA: Insufficient documentation

## 2014-03-31 DIAGNOSIS — E669 Obesity, unspecified: Secondary | ICD-10-CM | POA: Insufficient documentation

## 2014-03-31 DIAGNOSIS — Z8701 Personal history of pneumonia (recurrent): Secondary | ICD-10-CM | POA: Insufficient documentation

## 2014-03-31 DIAGNOSIS — Z79899 Other long term (current) drug therapy: Secondary | ICD-10-CM | POA: Insufficient documentation

## 2014-03-31 DIAGNOSIS — R0602 Shortness of breath: Secondary | ICD-10-CM | POA: Insufficient documentation

## 2014-03-31 LAB — CBC
HCT: 34.4 % — ABNORMAL LOW (ref 36.0–46.0)
HEMOGLOBIN: 11.6 g/dL — AB (ref 12.0–15.0)
MCH: 26.5 pg (ref 26.0–34.0)
MCHC: 33.7 g/dL (ref 30.0–36.0)
MCV: 78.7 fL (ref 78.0–100.0)
PLATELETS: 401 10*3/uL — AB (ref 150–400)
RBC: 4.37 MIL/uL (ref 3.87–5.11)
RDW: 12.6 % (ref 11.5–15.5)
WBC: 4.3 10*3/uL (ref 4.0–10.5)

## 2014-03-31 LAB — URINALYSIS, ROUTINE W REFLEX MICROSCOPIC
Bilirubin Urine: NEGATIVE
Glucose, UA: NEGATIVE mg/dL
HGB URINE DIPSTICK: NEGATIVE
Ketones, ur: 15 mg/dL — AB
Leukocytes, UA: NEGATIVE
Nitrite: NEGATIVE
PROTEIN: 30 mg/dL — AB
Specific Gravity, Urine: 1.023 (ref 1.005–1.030)
UROBILINOGEN UA: 1 mg/dL (ref 0.0–1.0)
pH: 6.5 (ref 5.0–8.0)

## 2014-03-31 LAB — BASIC METABOLIC PANEL
ANION GAP: 14 (ref 5–15)
BUN: 7 mg/dL (ref 6–23)
CO2: 23 mEq/L (ref 19–32)
Calcium: 9.6 mg/dL (ref 8.4–10.5)
Chloride: 101 mEq/L (ref 96–112)
Creatinine, Ser: 0.69 mg/dL (ref 0.50–1.10)
Glucose, Bld: 90 mg/dL (ref 70–99)
Potassium: 3.4 mEq/L — ABNORMAL LOW (ref 3.7–5.3)
Sodium: 138 mEq/L (ref 137–147)

## 2014-03-31 LAB — I-STAT TROPONIN, ED: Troponin i, poc: 0 ng/mL (ref 0.00–0.08)

## 2014-03-31 LAB — POC URINE PREG, ED: Preg Test, Ur: NEGATIVE

## 2014-03-31 LAB — URINE MICROSCOPIC-ADD ON

## 2014-03-31 MED ORDER — LORAZEPAM 1 MG PO TABS: 1.0000 mg | ORAL_TABLET | Freq: Four times a day (QID) | ORAL | Status: DC | PRN

## 2014-03-31 MED ORDER — LORAZEPAM 1 MG PO TABS
1.0000 mg | ORAL_TABLET | Freq: Once | ORAL | Status: AC
  Administered 2014-03-31: 1 mg via ORAL
  Filled 2014-03-31: qty 1

## 2014-03-31 NOTE — ED Notes (Signed)
Patient's o2 stats remained at 100 throughout ambulation.

## 2014-03-31 NOTE — ED Notes (Signed)
Per pt, states SOB since yesterday-states she feels chest "fluttering"-happens at rest and with activity

## 2014-03-31 NOTE — ED Notes (Signed)
Pt states started taking a diet pill yesterday, then started having SOB and heart "flutters", pt states nothing makes SOB worse or better. Pt denies pain.

## 2014-03-31 NOTE — ED Notes (Signed)
Patient transported to X-ray 

## 2014-03-31 NOTE — ED Provider Notes (Signed)
CSN: [id number]     Arrival date & time 03/31/14  0907 History   First MD Initiated Contact with Patient 03/31/14 0920     No chief complaint on file.    (Consider location/radiation/quality/duration/timing/severity/associated sxs/prior Treatment) HPI Comments: Patient is a 33 year old female who presents today with shortness of breath. She states that this began yesterday and persisted into today. Her shortness of breath is intermittent. It began soon after beginning phentermine. She does not have any associated chest pain, diaphoresis, pleuritic pain. Her shortness of breath is not positional. It is no prior history of coronary artery disease. She is not a smoker. She is on the [place] IUD.  The history is provided by the patient. No language interpreter was used.    Past Medical History  Diagnosis Date  . Pneumonia 2007  . Obesity    Past Surgical History  Procedure Laterality Date  . Cesarean section    . Intrauterine device insertion  02/2012    Mirena   Family History  Problem Relation Age of Onset  . Diabetes Mother   . Hypertension Mother   . Breast cancer Mother 25  . Breast cancer Maternal Grandmother 59  . Heart disease Neg Hx   . Stroke Neg Hx    History  Substance Use Topics  . Smoking status: Never Smoker   . Smokeless tobacco: Never Used  . Alcohol Use: No   OB History   Grav Para Term Preterm Abortions TAB SAB Ect Mult Living   3 2 2  1 1    2      Review of Systems  Constitutional: Negative for fever and chills.  Respiratory: Positive for shortness of breath. Negative for cough.   Cardiovascular: Negative for chest pain, palpitations and leg swelling.  Gastrointestinal: Negative for nausea, vomiting and abdominal pain.  All other systems reviewed and are negative.     Allergies  Review of patient's allergies indicates no known allergies.  Home Medications   Prior to Admission medications   Medication Sig Start Date End Date Taking?  Authorizing Provider  dimenhyDRINATE (DRAMAMINE) 50 MG tablet Take 50 mg by mouth every 8 (eight) hours as needed for dizziness.    Historical Provider, MD  levonorgestrel (MIRENA) 20 MCG/24HR IUD 1 each by Intrauterine route once.    Historical Provider, MD  meclizine (ANTIVERT) 50 MG tablet Take 0.5 tablets (25 mg total) by mouth 3 (three) times daily as needed for dizziness or nausea. 03/01/14   Clayton Bibles, PA-C   BP 104/65  Pulse 78  Temp(Src) 98 F (36.7 C) (Oral)  Resp 14  SpO2 100%  LMP 03/03/2014 Physical Exam  Nursing note and vitals reviewed. Constitutional: She is oriented to person, place, and time. She appears well-developed and well-nourished. No distress.  HENT:  Head: Normocephalic and atraumatic.  Right Ear: External ear normal.  Left Ear: External ear normal.  Nose: Nose normal.  Mouth/Throat: Oropharynx is clear and moist.  Eyes: Conjunctivae are normal.  Neck: Normal range of motion.  Cardiovascular: Normal rate, regular rhythm, normal heart sounds, intact distal pulses and normal pulses.   Pulses:      Radial pulses are 2+ on the right side, and 2+ on the left side.       Posterior tibial pulses are 2+ on the right side, and 2+ on the left side.  No leg swelling or calf tenderness  Pulmonary/Chest: Effort normal and breath sounds normal. No stridor. No respiratory distress. She has no wheezes. She  has no rales.  Abdominal: Soft. She exhibits no distension.  Musculoskeletal: Normal range of motion.  Neurological: She is alert and oriented to person, place, and time. She has normal strength.  Skin: Skin is warm and dry. She is not diaphoretic. No erythema.  Psychiatric: She has a normal mood and affect. Her behavior is normal.    ED Course  Procedures (including critical care time) Labs Review Labs Reviewed  BASIC METABOLIC PANEL - Abnormal; Notable for the following:    Potassium 3.4 (*)    All other components within normal limits  CBC - Abnormal;  Notable for the following:    Hemoglobin 11.6 (*)    HCT 34.4 (*)    Platelets 401 (*)    All other components within normal limits  URINALYSIS, ROUTINE W REFLEX MICROSCOPIC - Abnormal; Notable for the following:    APPearance CLOUDY (*)    Ketones, ur 15 (*)    Protein, ur 30 (*)    All other components within normal limits  URINE MICROSCOPIC-ADD ON  Randolm Idol, ED  POC URINE PREG, ED    Imaging Review Dg Chest 2 View  03/31/2014   CLINICAL DATA:  Chest pain, shortness of breath.  EXAM: CHEST  2 VIEW  COMPARISON:  December 27, 2007.  FINDINGS: The heart size and mediastinal contours are within normal limits. Both lungs are clear. No pneumothorax or pleural effusion is noted. The visualized skeletal structures are unremarkable.  IMPRESSION: No acute cardiopulmonary abnormality seen.   Electronically Signed   By: Sabino Dick M.D.   On: 03/31/2014 09:59     EKG Interpretation None      MDM   Final diagnoses:  Shortness of breath    Patient presents to ED with shortness of breath after beginning phentermine. No pleurtic component of this shortness of breath. No associated chest pain, diaphoresis. Shortness of breath in intermittent. Labs are reassuring. Clinically this does not appear to be a PE. Patient is not tachycardic, oxygen is 100% on room air. Patient was encouraged to stop the phentermine. Discussed reasons to return to ED immediately. Vital signs stable for discharge. Discussed case with Dr. Darl Householder who agrees with plan. Patient / Family / Caregiver informed of clinical course, understand medical decision-making process, and agree with plan.   Elwyn Lade, PA-C 04/04/14 1145

## 2014-03-31 NOTE — Discharge Instructions (Signed)
Please stop taking phentermine.   Shortness of Breath Shortness of breath means you have trouble breathing. It could also mean that you have a medical problem. You should get immediate medical care for shortness of breath. CAUSES   Not enough oxygen in the air such as with high altitudes or a smoke-filled room.  Certain lung diseases, infections, or problems.  Heart disease or conditions, such as angina or heart failure.  Low red blood cells (anemia).  Poor physical fitness, which can cause shortness of breath when you exercise.  Chest or back injuries or stiffness.  Being overweight.  Smoking.  Anxiety, which can make you feel like you are not getting enough air. DIAGNOSIS  Serious medical problems can often be found during your physical exam. Tests may also be done to determine why you are having shortness of breath. Tests may include:  Chest X-rays.  Lung function tests.  Blood tests.  An electrocardiogram (ECG).  An ambulatory electrocardiogram. An ambulatory ECG records your heartbeat patterns over a 24-hour period.  Exercise testing.  A transthoracic echocardiogram (TTE). During echocardiography, sound waves are used to evaluate how blood flows through your heart.  A transesophageal echocardiogram (TEE).  Imaging scans. Your health care provider may not be able to find a cause for your shortness of breath after your exam. In this case, it is important to have a follow-up exam with your health care provider as directed.  TREATMENT  Treatment for shortness of breath depends on the cause of your symptoms and can vary greatly. HOME CARE INSTRUCTIONS   Do not smoke. Smoking is a common cause of shortness of breath. If you smoke, ask for help to quit.  Avoid being around chemicals or things that may bother your breathing, such as paint fumes and dust.  Rest as needed. Slowly resume your usual activities.  If medicines were prescribed, take them as directed for  the full length of time directed. This includes oxygen and any inhaled medicines.  Keep all follow-up appointments as directed by your health care provider. SEEK MEDICAL CARE IF:   Your condition does not improve in the time expected.  You have a hard time doing your normal activities even with rest.  You have any new symptoms. SEEK IMMEDIATE MEDICAL CARE IF:   Your shortness of breath gets worse.  You feel light-headed, faint, or develop a cough not controlled with medicines.  You start coughing up blood.  You have pain with breathing.  You have chest pain or pain in your arms, shoulders, or abdomen.  You have a fever.  You are unable to walk up stairs or exercise the way you normally do. MAKE SURE YOU:  Understand these instructions.  Will watch your condition.  Will get help right away if you are not doing well or get worse. Document Released: 05/28/2001 Document Revised: 09/07/2013 Document Reviewed: 11/18/2011 [facility] Patient Information 2015 [place], [place]. This information is not intended to replace advice given to you by your health care provider. Make sure you discuss any questions you have with your health care provider.

## 2014-04-04 ENCOUNTER — Encounter: Payer: 59 | Admitting: Gynecology

## 2014-04-04 NOTE — ED Provider Notes (Signed)
Medical screening examination/treatment/procedure(s) were performed by non-physician practitioner and as supervising physician I was immediately available for consultation/collaboration.   EKG Interpretation   Date/Time:  Thursday March 31 2014 09:12:04 EDT Ventricular Rate:  99 PR Interval:  161 QRS Duration: 78 QT Interval:  334 QTC Calculation: 429 R Axis:   -29 Text Interpretation:  Sinus tachycardia Borderline left axis deviation Low  voltage, precordial leads Baseline wander in lead(s) V5 Since last tracing  rate faster Confirmed by YAO  MD, DAVID ([id number]) on 03/31/2014 9:33:09 AM        Wandra Arthurs, MD 04/04/14 1546

## 2014-07-15 ENCOUNTER — Encounter: Payer: 59 | Admitting: Gynecology

## 2014-07-18 ENCOUNTER — Encounter (HOSPITAL_COMMUNITY): Payer: Self-pay | Admitting: Emergency Medicine

## 2014-11-25 ENCOUNTER — Encounter: Payer: Self-pay | Admitting: Gynecology

## 2014-11-28 ENCOUNTER — Encounter (HOSPITAL_COMMUNITY): Payer: Self-pay | Admitting: Emergency Medicine

## 2014-11-28 ENCOUNTER — Emergency Department (HOSPITAL_COMMUNITY)
Admission: EM | Admit: 2014-11-28 | Discharge: 2014-11-28 | Disposition: A | Discharge status: Home / Self Care | Payer: 59 | Attending: Emergency Medicine | Admitting: Emergency Medicine

## 2014-11-28 ENCOUNTER — Emergency Department (HOSPITAL_COMMUNITY): Payer: 59

## 2014-11-28 DIAGNOSIS — Z8701 Personal history of pneumonia (recurrent): Secondary | ICD-10-CM | POA: Diagnosis not present

## 2014-11-28 DIAGNOSIS — R0602 Shortness of breath: Secondary | ICD-10-CM | POA: Diagnosis not present

## 2014-11-28 DIAGNOSIS — R0789 Other chest pain: Secondary | ICD-10-CM

## 2014-11-28 DIAGNOSIS — E669 Obesity, unspecified: Secondary | ICD-10-CM | POA: Insufficient documentation

## 2014-11-28 DIAGNOSIS — R079 Chest pain, unspecified: Secondary | ICD-10-CM | POA: Diagnosis present

## 2014-11-28 DIAGNOSIS — M6281 Muscle weakness (generalized): Secondary | ICD-10-CM | POA: Diagnosis not present

## 2014-11-28 DIAGNOSIS — R197 Diarrhea, unspecified: Secondary | ICD-10-CM | POA: Insufficient documentation

## 2014-11-28 DIAGNOSIS — R1111 Vomiting without nausea: Secondary | ICD-10-CM | POA: Insufficient documentation

## 2014-11-28 LAB — BASIC METABOLIC PANEL
Anion gap: 8 (ref 5–15)
BUN: 5 mg/dL — ABNORMAL LOW (ref 6–23)
CO2: 26 mmol/L (ref 19–32)
Calcium: 9 mg/dL (ref 8.4–10.5)
Chloride: 105 mmol/L (ref 96–112)
Creatinine, Ser: 0.61 mg/dL (ref 0.50–1.10)
GFR calc Af Amer: >90 mL/min (ref >90)
GFR calc non Af Amer: >90 mL/min (ref >90)
Glucose, Bld: 96 mg/dL (ref 70–99)
Potassium: 3.4 mmol/L — ABNORMAL LOW (ref 3.5–5.1)
Sodium: 139 mmol/L (ref 135–145)

## 2014-11-28 LAB — I-STAT TROPONIN, ED: Troponin i, poc: 0 ng/mL (ref 0.00–0.08)

## 2014-11-28 LAB — CBC
HCT: 35 % — ABNORMAL LOW (ref 36.0–46.0)
Hemoglobin: 11.5 g/dL — ABNORMAL LOW (ref 12.0–15.0)
MCH: 25.7 pg — ABNORMAL LOW (ref 26.0–34.0)
MCHC: 32.9 g/dL (ref 30.0–36.0)
MCV: 78.3 fL (ref 78.0–100.0)
Platelets: 386 10*3/uL (ref 150–400)
RBC: 4.47 MIL/uL (ref 3.87–5.11)
RDW: 12.9 % (ref 11.5–15.5)
WBC: 4.4 10*3/uL (ref 4.0–10.5)

## 2014-11-28 MED ORDER — ASPIRIN 325 MG PO TABS
325.0000 mg | ORAL_TABLET | Freq: Once | ORAL | Status: AC
  Administered 2014-11-28: 325 mg via ORAL
  Filled 2014-11-28: qty 1

## 2014-11-28 MED ORDER — GI COCKTAIL ~~LOC~~
30.0000 mL | Freq: Once | ORAL | Status: AC
  Administered 2014-11-28: 30 mL via ORAL
  Filled 2014-11-28: qty 30

## 2014-11-28 MED ORDER — OMEPRAZOLE 20 MG PO CPDR: 20.0000 mg | DELAYED_RELEASE_CAPSULE | Freq: Every day | ORAL | Status: DC

## 2014-11-28 NOTE — ED Notes (Signed)
Pt arrived POV with c/o chest pressure that radiates to back and SOB upon exertion. Pt stated that she had a virus that started Friday with vomiting and diarrhea that has subsided and then yesterday noticed the chest pressure with SOB. Pt stated that pressure in chest radiates to back and has some weakness as well.

## 2014-11-28 NOTE — ED Provider Notes (Signed)
CSN: [id number]     Arrival date & time 11/28/14  0900 History   First MD Initiated Contact with Patient 11/28/14 [phone]     Chief Complaint  Patient presents with  . Chest Pain  . Shortness of Breath     (Consider location/radiation/quality/duration/timing/severity/associated sxs/prior Treatment) HPI Pt is a 34yo female presenting to ED with c/o new onset chest pressure and pain that started last night, radiates to her back, "tightness" 9/10 at worst, has been constant since onset last night, associated with SOB upon exertion that started this morning with mild generalized weakness.  Nothing makes pain better or worse, however, no pain medications tried PTA.  Pt states she believes she had a virus that started Friday, 3/11, with associated vomiting and diarrhea that subsided by Saturday, 11/26/14.   Denies cough or congestion.  Denies sick contacts or recent travel. No hx of asthma or COPD. Denies hx of CAD.  No previous hx of similar chest pain.  Denies leg pain or swelling. No previous hx of blood clots.   Past Medical History  Diagnosis Date  . Pneumonia 2007  . Obesity    Past Surgical History  Procedure Laterality Date  . Cesarean section    . Intrauterine device insertion  02/2012    Mirena   Family History  Problem Relation Age of Onset  . Diabetes Mother   . Hypertension Mother   . Breast cancer Mother 18  . Breast cancer Maternal Grandmother 69  . Heart disease Neg Hx   . Stroke Neg Hx    History  Substance Use Topics  . Smoking status: Never Smoker   . Smokeless tobacco: Never Used  . Alcohol Use: No   OB History    Gravida Para Term Preterm AB TAB SAB Ectopic Multiple Living   3 2 2  1 1    2      Review of Systems  Constitutional: Negative for fever, chills and appetite change.  HENT: Negative for congestion and sore throat.   Respiratory: Positive for shortness of breath. Negative for cough.   Cardiovascular: Positive for chest pain. Negative for  palpitations and leg swelling.  Gastrointestinal: Positive for vomiting and diarrhea. Negative for nausea and abdominal pain.  Genitourinary: Negative for dysuria, hematuria and flank pain.  Musculoskeletal: Positive for back pain. Negative for myalgias.  Neurological: Negative for dizziness, syncope, light-headedness, numbness and headaches.  All other systems reviewed and are negative.     Allergies  Review of patient's allergies indicates no known allergies.  Home Medications   Prior to Admission medications   Medication Sig Start Date End Date Taking? Authorizing Provider  levonorgestrel (MIRENA) 20 MCG/24HR IUD 1 each by Intrauterine route once.   Yes Historical Provider, MD  LORazepam (ATIVAN) 1 MG tablet Take 1 tablet (1 mg total) by mouth every 6 (six) hours as needed for anxiety. Patient not taking: Reported on 11/28/2014 03/31/14   Cleatrice Burke, PA-C  omeprazole (PRILOSEC) 20 MG capsule Take 1 capsule (20 mg total) by mouth daily. 11/28/14   Noland Fordyce, PA-C   BP 119/76 mmHg  Pulse 76  Temp(Src) 97.9 F (36.6 C)  Resp 20  Ht 5\' 2"  (1.575 m)  Wt 270 lb (122.471 kg)  BMI 49.37 kg/m2  SpO2 100% Physical Exam  Constitutional: She appears well-developed and well-nourished. No distress.  HENT:  Head: Normocephalic and atraumatic.  Eyes: Conjunctivae are normal. No scleral icterus.  Neck: Normal range of motion.  Cardiovascular: Normal rate, regular rhythm  and normal heart sounds.   Pulmonary/Chest: Effort normal and breath sounds normal. No respiratory distress. She has no wheezes. She has no rales. She exhibits no tenderness.  Abdominal: Soft. Bowel sounds are normal. She exhibits no distension and no mass. There is no tenderness. There is no rebound and no guarding.  Musculoskeletal: Normal range of motion.  Neurological: She is alert.  Skin: Skin is warm and dry. She is not diaphoretic.  Nursing note and vitals reviewed.   ED Course  Procedures (including  critical care time) Labs Review Labs Reviewed  BASIC METABOLIC PANEL - Abnormal; Notable for the following:    Potassium 3.4 (*)    BUN 5 (*)    All other components within normal limits  CBC - Abnormal; Notable for the following:    Hemoglobin 11.5 (*)    HCT 35.0 (*)    MCH 25.7 (*)    All other components within normal limits  Randolm Idol, ED    Imaging Review Dg Chest 2 View  11/28/2014   CLINICAL DATA:  Chest pain and shortness of breath  EXAM: CHEST  2 VIEW  COMPARISON:  03/31/2014  FINDINGS: Normal heart size and mediastinal contours. No acute infiltrate or edema. No effusion or pneumothorax. No acute osseous findings.  IMPRESSION: Negative chest.   Electronically Signed   By: Monte Fantasia M.D.   On: 11/28/2014 09:39     EKG Interpretation   Date/Time:  Monday November 28 2014 09:10:06 EDT Ventricular Rate:  79 PR Interval:  175 QRS Duration: 82 QT Interval:  361 QTC Calculation: 414 R Axis:   -30 Text Interpretation:  Sinus rhythm Left axis deviation Low voltage,  precordial leads No significant change since last tracing Confirmed by YAO   MD, DAVID ([id number]) on 11/28/2014 10:15:41 AM      MDM   Final diagnoses:  Other chest pain  Shortness of breath    Pt is a 34yo obese female presenting to ED with c/o new onset chest pain and pressure that started earlier this morning, she reports recent "virus" of vomiting and diarrhea this weekend which has since resolved. No previous hx of CAD or asthma. No cough or congestion. Pt is afebrile, 100% O2 Sat on RA.  HR: 92, regular rhythm, Resp: 20.  Will get EKG, troponin, CXR, CBC and BMP.  Pt given 324mg  aspirin in ED as she requested pain medication for 9/10 pain, no pain medication PTA  10:51 AM Pt states that her pain has improved to a 5/10.  States she does think it feels similar to acid reflux in the past.  Would like to try GI cocktail.  Due to CP starting last night, discussed pt with Dr. Darl Householder who also examined  pt, EKG unchanged, i-stat troponin: negative for elevation, pt is safe for discharge home to f/u with PCP. Strict return precautions of worsening chest pain or SOB provided to pt. Pt verbalized understanding and agreement with tx plan.  Filed Vitals:   11/28/14 1052  BP: 119/76  Pulse: 76  Temp:  97.9 F at 0907AM  Resp: 20        O2 Sat- 100% on RA at 0907AM and 1052AM     Noland Fordyce, PA-C 11/28/14 [place] Yao, MD 11/28/14 [phone]

## 2014-11-28 NOTE — ED Notes (Signed)
Pt left prior to receiving d/c paper work. PA stated that she went over discharge instructions prior to patient leaving.

## 2014-12-27 ENCOUNTER — Encounter: Payer: Self-pay | Admitting: Gynecology

## 2014-12-27 ENCOUNTER — Other Ambulatory Visit (HOSPITAL_COMMUNITY)
Admission: RE | Admit: 2014-12-27 | Discharge: 2014-12-27 | Disposition: A | Discharge status: Home / Self Care | Payer: 59 | Source: Ambulatory Visit | Attending: Gynecology | Admitting: Gynecology

## 2014-12-27 ENCOUNTER — Ambulatory Visit (INDEPENDENT_AMBULATORY_CARE_PROVIDER_SITE_OTHER): Payer: 59 | Admitting: Gynecology

## 2014-12-27 VITALS — BP 124/82 | Ht 63.0 in | Wt 283.0 lb

## 2014-12-27 DIAGNOSIS — Z01419 Encounter for gynecological examination (general) (routine) without abnormal findings: Secondary | ICD-10-CM

## 2014-12-27 DIAGNOSIS — Z30431 Encounter for routine checking of intrauterine contraceptive device: Secondary | ICD-10-CM | POA: Diagnosis not present

## 2014-12-27 DIAGNOSIS — Z1151 Encounter for screening for human papillomavirus (HPV): Secondary | ICD-10-CM | POA: Insufficient documentation

## 2014-12-27 DIAGNOSIS — L723 Sebaceous cyst: Secondary | ICD-10-CM

## 2014-12-27 DIAGNOSIS — N907 Vulvar cyst: Secondary | ICD-10-CM

## 2014-12-27 NOTE — Patient Instructions (Signed)
You may obtain a copy of any labs that were done today by logging onto MyChart as outlined in the instructions provided with your AVS (after visit summary). The office will not call with normal lab results but certainly if there are any significant abnormalities then we will contact you.   Health Maintenance, Female A healthy lifestyle and preventative care can promote health and wellness.  Maintain regular health, dental, and eye exams.  Eat a healthy diet. Foods like vegetables, fruits, whole grains, low-fat dairy products, and lean protein foods contain the nutrients you need without too many calories. Decrease your intake of foods high in solid fats, added sugars, and salt. Get information about a proper diet from your caregiver, if necessary.  Regular physical exercise is one of the most important things you can do for your health. Most adults should get at least 150 minutes of moderate-intensity exercise (any activity that increases your heart rate and causes you to sweat) each week. In addition, most adults need muscle-strengthening exercises on 2 or more days a week.   Maintain a healthy weight. The body mass index (BMI) is a screening tool to identify possible weight problems. It provides an estimate of body fat based on height and weight. Your caregiver can help determine your BMI, and can help you achieve or maintain a healthy weight. For adults 20 years and older:  A BMI below 18.5 is considered underweight.  A BMI of 18.5 to 24.9 is normal.  A BMI of 25 to 29.9 is considered overweight.  A BMI of 30 and above is considered obese.  Maintain normal blood lipids and cholesterol by exercising and minimizing your intake of saturated fat. Eat a balanced diet with plenty of fruits and vegetables. Blood tests for lipids and cholesterol should begin at age 61 and be repeated every 5 years. If your lipid or cholesterol levels are high, you are over 50, or you are a high risk for heart  disease, you may need your cholesterol levels checked more frequently.Ongoing high lipid and cholesterol levels should be treated with medicines if diet and exercise are not effective.  If you smoke, find out from your caregiver how to quit. If you do not use tobacco, do not start.  Lung cancer screening is recommended for adults aged 33 80 years who are at high risk for developing lung cancer because of a history of smoking. Yearly low-dose computed tomography (CT) is recommended for people who have at least a 30-pack-year history of smoking and are a current smoker or have quit within the past 15 years. A pack year of smoking is smoking an average of 1 pack of cigarettes a day for 1 year (for example: 1 pack a day for 30 years or 2 packs a day for 15 years). Yearly screening should continue until the smoker has stopped smoking for at least 15 years. Yearly screening should also be stopped for people who develop a health problem that would prevent them from having lung cancer treatment.  If you are pregnant, do not drink alcohol. If you are breastfeeding, be very cautious about drinking alcohol. If you are not pregnant and choose to drink alcohol, do not exceed 1 drink per day. One drink is considered to be 12 ounces (355 mL) of beer, 5 ounces (148 mL) of wine, or 1.5 ounces (44 mL) of liquor.  Avoid use of street drugs. Do not share needles with anyone. Ask for help if you need support or instructions about stopping  the use of drugs.  High blood pressure causes heart disease and increases the risk of stroke. Blood pressure should be checked at least every 1 to 2 years. Ongoing high blood pressure should be treated with medicines, if weight loss and exercise are not effective.  If you are 59 to 35 years old, ask your caregiver if you should take aspirin to prevent strokes.  Diabetes screening involves taking a blood sample to check your fasting blood sugar level. This should be done once every 3  years, after age 91, if you are within normal weight and without risk factors for diabetes. Testing should be considered at a younger age or be carried out more frequently if you are overweight and have at least 1 risk factor for diabetes.  Breast cancer screening is essential preventative care for women. You should practice "breast self-awareness." This means understanding the normal appearance and feel of your breasts and may include breast self-examination. Any changes detected, no matter how small, should be reported to a caregiver. Women in their 66s and 30s should have a clinical breast exam (CBE) by a caregiver as part of a regular health exam every 1 to 3 years. After age 101, women should have a CBE every year. Starting at age 100, women should consider having a mammogram (breast X-ray) every year. Women who have a family history of breast cancer should talk to their caregiver about genetic screening. Women at a high risk of breast cancer should talk to their caregiver about having an MRI and a mammogram every year.  Breast cancer gene (BRCA)-related cancer risk assessment is recommended for women who have family members with BRCA-related cancers. BRCA-related cancers include breast, ovarian, tubal, and peritoneal cancers. Having family members with these cancers may be associated with an increased risk for harmful changes (mutations) in the breast cancer genes BRCA1 and BRCA2. Results of the assessment will determine the need for genetic counseling and BRCA1 and BRCA2 testing.  The Pap test is a screening test for cervical cancer. Women should have a Pap test starting at age 57. Between ages 25 and 35, Pap tests should be repeated every 2 years. Beginning at age 37, you should have a Pap test every 3 years as long as the past 3 Pap tests have been normal. If you had a hysterectomy for a problem that was not cancer or a condition that could lead to cancer, then you no longer need Pap tests. If you are  between ages 50 and 76, and you have had normal Pap tests going back 10 years, you no longer need Pap tests. If you have had past treatment for cervical cancer or a condition that could lead to cancer, you need Pap tests and screening for cancer for at least 20 years after your treatment. If Pap tests have been discontinued, risk factors (such as a new sexual partner) need to be reassessed to determine if screening should be resumed. Some women have medical problems that increase the chance of getting cervical cancer. In these cases, your caregiver may recommend more frequent screening and Pap tests.  The human papillomavirus (HPV) test is an additional test that may be used for cervical cancer screening. The HPV test looks for the virus that can cause the cell changes on the cervix. The cells collected during the Pap test can be tested for HPV. The HPV test could be used to screen women aged 44 years and older, and should be used in women of any age  who have unclear Pap test results. After the age of 55, women should have HPV testing at the same frequency as a Pap test.  Colorectal cancer can be detected and often prevented. Most routine colorectal cancer screening begins at the age of 44 and continues through age 20. However, your caregiver may recommend screening at an earlier age if you have risk factors for colon cancer. On a yearly basis, your caregiver may provide home test kits to check for hidden blood in the stool. Use of a small camera at the end of a tube, to directly examine the colon (sigmoidoscopy or colonoscopy), can detect the earliest forms of colorectal cancer. Talk to your caregiver about this at age 86, when routine screening begins. Direct examination of the colon should be repeated every 5 to 10 years through age 13, unless early forms of pre-cancerous polyps or small growths are found.  Hepatitis C blood testing is recommended for all people born from [birth year] through 1966 and any  individual with known risks for hepatitis C.  Practice safe sex. Use condoms and avoid high-risk sexual practices to reduce the spread of sexually transmitted infections (STIs). Sexually active women aged 36 and younger should be checked for Chlamydia, which is a common sexually transmitted infection. Older women with new or multiple partners should also be tested for Chlamydia. Testing for other STIs is recommended if you are sexually active and at increased risk.  Osteoporosis is a disease in which the bones lose minerals and strength with aging. This can result in serious bone fractures. The risk of osteoporosis can be identified using a bone density scan. Women ages 20 and over and women at risk for fractures or osteoporosis should discuss screening with their caregivers. Ask your caregiver whether you should be taking a calcium supplement or vitamin D to reduce the rate of osteoporosis.  Menopause can be associated with physical symptoms and risks. Hormone replacement therapy is available to decrease symptoms and risks. You should talk to your caregiver about whether hormone replacement therapy is right for you.  Use sunscreen. Apply sunscreen liberally and repeatedly throughout the day. You should seek shade when your shadow is shorter than you. Protect yourself by wearing long sleeves, pants, a wide-brimmed hat, and sunglasses year round, whenever you are outdoors.  Notify your caregiver of new moles or changes in moles, especially if there is a change in shape or color. Also notify your caregiver if a mole is larger than the size of a pencil eraser.  Stay current with your immunizations. Document Released: 03/18/2011 Document Revised: 12/28/2012 Document Reviewed: 03/18/2011 [facility] Patient Information 2014 [place].

## 2014-12-27 NOTE — Progress Notes (Signed)
[name] 1981/08/04 [id number]        33 y.o.  [id number] for annual exam.  Several issues noted below.  Past medical history,surgical history, problem list, medications, allergies, family history and social history were all reviewed and documented as reviewed in the EPIC chart.  ROS:  Performed with pertinent positives and negatives included in the history, assessment and plan.   Additional significant findings :  none   Exam: Kim Counsellor Vitals:   12/27/14 1451  BP: 124/82  Height: 5\' 3"  (1.6 m)  Weight: 283 lb (128.368 kg)   General appearance:  Normal affect, orientation and appearance. Skin: Grossly normal HEENT: Without gross lesions.  No cervical or supraclavicular adenopathy. Thyroid normal.  Lungs:  Clear without wheezing, rales or rhonchi Cardiac: RR, without RMG Abdominal:  Soft, nontender, without masses, guarding, rebound, organomegaly or hernia Breasts:  Examined lying and sitting without masses, retractions, discharge or axillary adenopathy.  Hyperpigmented areas under both breasts where prior boils were. Pelvic:  Ext/BUS/vagina normal excepting 1 cm classic sebaceous cyst lower inner right labia majora  Cervix normal. Pap/HPV. IUD string palpated at external os  Uterus grossly normal size, midline and mobile nontender   Adnexa  Without gross masses or tenderness    Anus and perineum  Normal   Rectovaginal  Normal sphincter tone without palpated masses or tenderness.    Assessment/Plan:  34 y.o. [id number] female for annual exam  Without menses, Mirena IUD.   1. Mirena IUD 02/2012. Doing well with scant to absent menses. IUD string palpated at external os. 2. Pap smear 2012. Pap smear/HPV today. No history of significant abnormal Pap smears previously. 3. Hyperpigmented areas under both breasts from prior boils. Patient had questions about this. No active boils or abnormalities now.  Patient will monitor and treat symptomatically and they occur with  heat. 4. Small sebaceous cyst right lower inner labia majora. Patient noticed this while bathing. Does not cause her any symptoms or discomfort. Classic in appearance. Reassured her and she'll observe. His lungs remain stable will follow. 5. Mammography never. We'll plan next year at age 60. She does have a history of breast cancer in her mother postmenopausally and maternal grandmother. SBE monthly reviewed.   6. Health maintenance. No routine blood work done as she reports this done through her primary physician's office. Follow up in one year, sooner as needed.     Anastasio Auerbach MD, 3:10 PM 12/27/2014

## 2014-12-27 NOTE — Addendum Note (Signed)
Addended by: Nelva Nay on: 12/27/2014 04:02 PM   Modules accepted: Orders

## 2014-12-29 LAB — CYTOLOGY - PAP

## 2015-01-06 ENCOUNTER — Encounter: Payer: Self-pay | Admitting: Gynecology

## 2015-01-06 ENCOUNTER — Ambulatory Visit (INDEPENDENT_AMBULATORY_CARE_PROVIDER_SITE_OTHER): Payer: 59 | Admitting: Gynecology

## 2015-01-06 VITALS — BP 134/84

## 2015-01-06 DIAGNOSIS — N61 Inflammatory disorders of breast: Secondary | ICD-10-CM | POA: Diagnosis not present

## 2015-01-06 DIAGNOSIS — N611 Abscess of the breast and nipple: Secondary | ICD-10-CM

## 2015-01-06 MED ORDER — DOXYCYCLINE HYCLATE 100 MG PO CAPS: 100.0000 mg | ORAL_CAPSULE | Freq: Two times a day (BID) | ORAL | Status: DC

## 2015-01-06 NOTE — Progress Notes (Signed)
[name] 07/28/1981 [id number]        33 y.o.  [id number] presents with several day history of sore area in the right breast. Does have a history of recurrent boils under both breasts. Recent annual exam was normal.  Past medical history,surgical history, problem list, medications, allergies, family history and social history were all reviewed and documented in the EPIC chart.  Directed ROS with pertinent positives and negatives documented in the history of present illness/assessment and plan.  Exam: Kim assistant Filed Vitals:   01/06/15 1147  BP: 134/84   General appearance:  Normal Both breasts examined. Left without masses retractions discharge adenopathy. Right with 3 fluctuant small boils as diagrammed. No drainage. No cellulitis. No other masses retractions discharge adenopathy. Physical Exam  Pulmonary/Chest:       Assessment/Plan:  34 y.o. G3T5176 with 3 small breast boils. Recommend warm compresses and short course of doxycycline 100 mg twice a day 7 days.  Follow up if symptoms persist, worsen or recur.    Anastasio Auerbach MD, 12:04 PM 01/06/2015

## 2015-01-06 NOTE — Patient Instructions (Signed)
Take antibiotic twice daily for 7 days. Apply warm compresses to the sore areas of the right breast. Follow up if symptoms persist or worsen.

## 2015-02-17 ENCOUNTER — Encounter: Payer: Self-pay | Admitting: Gynecology

## 2015-02-17 ENCOUNTER — Ambulatory Visit (INDEPENDENT_AMBULATORY_CARE_PROVIDER_SITE_OTHER): Payer: 59 | Admitting: Gynecology

## 2015-02-17 VITALS — BP 130/80 | Ht 63.0 in | Wt 286.0 lb

## 2015-02-17 DIAGNOSIS — N61 Inflammatory disorders of breast: Secondary | ICD-10-CM

## 2015-02-17 DIAGNOSIS — N611 Abscess of the breast and nipple: Secondary | ICD-10-CM

## 2015-02-17 MED ORDER — DOXYCYCLINE HYCLATE 100 MG PO CAPS: 100.0000 mg | ORAL_CAPSULE | Freq: Two times a day (BID) | ORAL | Status: DC

## 2015-02-17 NOTE — Patient Instructions (Signed)
Take the antibiotic twice daily. Follow up if this happens again or does not resolve.  Abscess An abscess is an infected area that contains a collection of pus and debris.It can occur in almost any part of the body. An abscess is also known as a furuncle or boil. CAUSES  An abscess occurs when tissue gets infected. This can occur from blockage of oil or sweat glands, infection of hair follicles, or a minor injury to the skin. As the body tries to fight the infection, pus collects in the area and creates pressure under the skin. This pressure causes pain. People with weakened immune systems have difficulty fighting infections and get certain abscesses more often.  SYMPTOMS Usually an abscess develops on the skin and becomes a painful mass that is red, warm, and tender. If the abscess forms under the skin, you may feel a moveable soft area under the skin. Some abscesses break open (rupture) on their own, but most will continue to get worse without care. The infection can spread deeper into the body and eventually into the bloodstream, causing you to feel ill.  DIAGNOSIS  Your caregiver will take your medical history and perform a physical exam. A sample of fluid may also be taken from the abscess to determine what is causing your infection. TREATMENT  Your caregiver may prescribe antibiotic medicines to fight the infection. However, taking antibiotics alone usually does not cure an abscess. Your caregiver may need to make a small cut (incision) in the abscess to drain the pus. In some cases, gauze is packed into the abscess to reduce pain and to continue draining the area. HOME CARE INSTRUCTIONS   Only take over-the-counter or prescription medicines for pain, discomfort, or fever as directed by your caregiver.  If you were prescribed antibiotics, take them as directed. Finish them even if you start to feel better.  If gauze is used, follow your caregiver's directions for changing the gauze.  To  avoid spreading the infection:  Keep your draining abscess covered with a bandage.  Wash your hands well.  Do not share personal care items, towels, or whirlpools with others.  Avoid skin contact with others.  Keep your skin and clothes clean around the abscess.  Keep all follow-up appointments as directed by your caregiver. SEEK MEDICAL CARE IF:   You have increased pain, swelling, redness, fluid drainage, or bleeding.  You have muscle aches, chills, or a general ill feeling.  You have a fever. MAKE SURE YOU:   Understand these instructions.  Will watch your condition.  Will get help right away if you are not doing well or get worse. Document Released: 06/12/2005 Document Revised: 03/03/2012 Document Reviewed: 11/15/2011 [facility] Patient Information 2015 [place], [place]. This information is not intended to replace advice given to you by your health care provider. Make sure you discuss any questions you have with your health care provider.

## 2015-02-17 NOTE — Progress Notes (Signed)
[name] May 28, 1981 [id number]        34 y.o.  T2W5809 presents having been seen in April with 3 small boils on her right breast. Was treated with warm compresses and doxycycline. These areas resolve the patient now notes a recurrence in one area. No fever chills nausea vomiting or other constitutional symptoms.  Past medical history,surgical history, problem list, medications, allergies, family history and social history were all reviewed and documented in the EPIC chart.  Directed ROS with pertinent positives and negatives documented in the history of present illness/assessment and plan.  Exam: Wandra Scot assistant Filed Vitals:   02/17/15 0906  BP: 130/80  Height: 5\' 3"  (1.6 m)  Weight: 286 lb (129.729 kg)   General appearance:  Normal Both breasts examined. Left without masses retractions discharge adenopathy. Right with fluctuant 3 cm boil 6:00 periphery. No evidence of surrounding cellulitis. No other masses retractions discharge or axillary adenopathy. Physical Exam  Pulmonary/Chest:      Procedure: Skin overlying the boil was cleansed with alcohol and using an 18-gauge needle the boil was lanced with a small extension of the incision using the needle tip with purulent material extruding. Culture taken. Boil completely emptied. Sterile dressing applied.  Assessment/Plan:  34 y.o. [id number] with recurrent right breast boil. Area was I&D'ed. Await culture results/rule out MRSA. Doxycycline 100 mg twice a day 10 days.  Continue with warm soaks. Reviewed with patient if continues to be recurrent will consider general surgical referral to excise this area.  Follow up if this area persists or worsens.    Anastasio Auerbach MD, 9:28 AM 02/17/2015

## 2015-02-17 NOTE — Addendum Note (Signed)
Addended by: Burnett Kanaris on: 02/17/2015 12:16 PM   Modules accepted: Orders

## 2015-02-20 LAB — WOUND CULTURE
Gram Stain: NONE SEEN
Gram Stain: NONE SEEN
ORGANISM ID, BACTERIA: NO GROWTH

## 2016-01-05 ENCOUNTER — Telehealth: Payer: Self-pay | Admitting: *Deleted

## 2016-01-05 ENCOUNTER — Encounter: Payer: Self-pay | Admitting: Gynecology

## 2016-01-05 ENCOUNTER — Ambulatory Visit (INDEPENDENT_AMBULATORY_CARE_PROVIDER_SITE_OTHER): Payer: 59 | Admitting: Gynecology

## 2016-01-05 VITALS — BP 122/78 | Ht 63.0 in | Wt 274.0 lb

## 2016-01-05 DIAGNOSIS — N611 Abscess of the breast and nipple: Secondary | ICD-10-CM

## 2016-01-05 DIAGNOSIS — IMO0002 Reserved for concepts with insufficient information to code with codable children: Secondary | ICD-10-CM

## 2016-01-05 DIAGNOSIS — Z30431 Encounter for routine checking of intrauterine contraceptive device: Secondary | ICD-10-CM

## 2016-01-05 DIAGNOSIS — Z01419 Encounter for gynecological examination (general) (routine) without abnormal findings: Secondary | ICD-10-CM | POA: Diagnosis not present

## 2016-01-05 DIAGNOSIS — Z803 Family history of malignant neoplasm of breast: Secondary | ICD-10-CM

## 2016-01-05 NOTE — Patient Instructions (Addendum)
Call to Schedule your mammogram  Facilities in [place]: 1)  [facility]. [facility], [address] Phone: [phone] 2)  Dr. Bertrand at [facility]  [address] Phone: [phone]     Mammogram A mammogram is an X-ray test to find changes in a woman's breast. You should get a mammogram if:  You are 36 years of age or older  You have risk factors.   Your doctor recommends that you have one.  BEFORE THE TEST  Do not schedule the test the week before your period, especially if your breasts are sore during this time.  On the day of your mammogram:  Wash your breasts and armpits well. After washing, do not put on any deodorant or talcum powder on until after your test.   Eat and drink as you usually do.   Take your medicines as usual.   If you are diabetic and take insulin, make sure you:   Eat before coming for your test.   Take your insulin as usual.   If you cannot keep your appointment, call before the appointment to cancel. Schedule another appointment.  TEST  You will need to undress from the waist up. You will put on a hospital gown.   Your breast will be put on the mammogram machine, and it will press firmly on your breast with a piece of plastic called a compression paddle. This will make your breast flatter so that the machine can X-ray all parts of your breast.   Both breasts will be X-rayed. Each breast will be X-rayed from above and from the side. An X-ray might need to be taken again if the picture is not good enough.   The mammogram will last about 15 to 30 minutes.  AFTER THE TEST Finding out the results of your test Ask when your test results will be ready. Make sure you get your test results.  Document Released: 11/29/2008 Document Revised: 08/22/2011 Document Reviewed: 11/29/2008 ExitCare Patient Information 2012 [employer].  You may obtain a copy of any labs that were  done today by logging onto MyChart as outlined in the instructions provided with your AVS (after visit summary). The office will not call with normal lab results but certainly if there are any significant abnormalities then we will contact you.   Health Maintenance Adopting a healthy lifestyle and getting preventive care can go a long way to promote health and wellness. Talk with your health care provider about what schedule of regular examinations is right for you. This is a good chance for you to check in with your provider about disease prevention and staying healthy. In between checkups, there are plenty of things you can do on your own. Experts have done a lot of research about which lifestyle changes and preventive measures are most likely to keep you healthy. Ask your health care provider for more information. WEIGHT AND DIET  Eat a healthy diet  Be sure to include plenty of vegetables, fruits, low-fat dairy products, and lean protein.  Do not eat a lot of foods high in solid fats, added sugars, or salt.  Get regular exercise. This is one of the most important things you can do for your health. 1. Most adults should exercise for at least 150 minutes each week. The exercise should increase your heart rate and make you sweat (moderate-intensity exercise). 2. Most adults should also do strengthening exercises at least twice a   week. This is in addition to the moderate-intensity exercise.  Maintain a healthy weight  Body mass index (BMI) is a measurement that can be used to identify possible weight problems. It estimates body fat based on height and weight. Your health care provider can help determine your BMI and help you achieve or maintain a healthy weight.  For females 20 years of age and older:  1. A BMI below 18.5 is considered underweight. 2. A BMI of 18.5 to 24.9 is normal. 3. A BMI of 25 to 29.9 is considered overweight. 4. A BMI of 30 and above is considered obese.  Watch  levels of cholesterol and blood lipids  You should start having your blood tested for lipids and cholesterol at 36 years of age, then have this test every 5 years.  You may need to have your cholesterol levels checked more often if: 1. Your lipid or cholesterol levels are high. 2. You are older than 36 years of age. 3. You are at high risk for heart disease.  CANCER SCREENING   Lung Cancer  Lung cancer screening is recommended for adults 55-80 years old who are at high risk for lung cancer because of a history of smoking.  A yearly low-dose CT scan of the lungs is recommended for people who: 1. Currently smoke. 2. Have quit within the past 15 years. 3. Have at least a 30-pack-year history of smoking. A pack year is smoking an average of one pack of cigarettes a day for 1 year.  Yearly screening should continue until it has been 15 years since you quit.  Yearly screening should stop if you develop a health problem that would prevent you from having lung cancer treatment.  Breast Cancer  Practice breast self-awareness. This means understanding how your breasts normally appear and feel.  It also means doing regular breast self-exams. Let your health care provider know about any changes, no matter how small.  If you are in your 20s or 30s, you should have a clinical breast exam (CBE) by a health care provider every 1-3 years as part of a regular health exam.  If you are 40 or older, have a CBE every year. Also consider having a breast X-ray (mammogram) every year.  If you have a family history of breast cancer, talk to your health care provider about genetic screening.  If you are at high risk for breast cancer, talk to your health care provider about having an MRI and a mammogram every year.  Breast cancer gene (BRCA) assessment is recommended for women who have family members with BRCA-related cancers. BRCA-related cancers include:  Breast.  Ovarian.  Tubal.  Peritoneal  cancers.  Results of the assessment will determine the need for genetic counseling and BRCA1 and BRCA2 testing. Cervical Cancer Routine pelvic examinations to screen for cervical cancer are no longer recommended for nonpregnant women who are considered low risk for cancer of the pelvic organs (ovaries, uterus, and vagina) and who do not have symptoms. A pelvic examination may be necessary if you have symptoms including those associated with pelvic infections. Ask your health care provider if a screening pelvic exam is right for you.   The Pap test is the screening test for cervical cancer for women who are considered at risk.  If you had a hysterectomy for a problem that was not cancer or a condition that could lead to cancer, then you no longer need Pap tests.  If you are older than 65 years, and   you have had normal Pap tests for the past 10 years, you no longer need to have Pap tests.  If you have had past treatment for cervical cancer or a condition that could lead to cancer, you need Pap tests and screening for cancer for at least 20 years after your treatment.  If you no longer get a Pap test, assess your risk factors if they change (such as having a new sexual partner). This can affect whether you should start being screened again.  Some women have medical problems that increase their chance of getting cervical cancer. If this is the case for you, your health care provider may recommend more frequent screening and Pap tests.  The human papillomavirus (HPV) test is another test that may be used for cervical cancer screening. The HPV test looks for the virus that can cause cell changes in the cervix. The cells collected during the Pap test can be tested for HPV.  The HPV test can be used to screen women 30 years of age and older. Getting tested for HPV can extend the interval between normal Pap tests from three to five years.  An HPV test also should be used to screen women of any age who  have unclear Pap test results.  After 36 years of age, women should have HPV testing as often as Pap tests.  Colorectal Cancer  This type of cancer can be detected and often prevented.  Routine colorectal cancer screening usually begins at 36 years of age and continues through 36 years of age.  Your health care provider may recommend screening at an earlier age if you have risk factors for colon cancer.  Your health care provider may also recommend using home test kits to check for hidden blood in the stool.  A small camera at the end of a tube can be used to examine your colon directly (sigmoidoscopy or colonoscopy). This is done to check for the earliest forms of colorectal cancer.  Routine screening usually begins at age 50.  Direct examination of the colon should be repeated every 5-10 years through 36 years of age. However, you may need to be screened more often if early forms of precancerous polyps or small growths are found. Skin Cancer  Check your skin from head to toe regularly.  Tell your health care provider about any new moles or changes in moles, especially if there is a change in a mole's shape or color.  Also tell your health care provider if you have a mole that is larger than the size of a pencil eraser.  Always use sunscreen. Apply sunscreen liberally and repeatedly throughout the day.  Protect yourself by wearing long sleeves, pants, a wide-brimmed hat, and sunglasses whenever you are outside. HEART DISEASE, DIABETES, AND HIGH BLOOD PRESSURE   Have your blood pressure checked at least every 1-2 years. High blood pressure causes heart disease and increases the risk of stroke.  If you are between 55 years and 79 years old, ask your health care provider if you should take aspirin to prevent strokes.  Have regular diabetes screenings. This involves taking a blood sample to check your fasting blood sugar level.  If you are at a normal weight and have a low risk for  diabetes, have this test once every three years after 36 years of age.  If you are overweight and have a high risk for diabetes, consider being tested at a younger age or more often. PREVENTING INFECTION  Hepatitis B    If you have a higher risk for hepatitis B, you should be screened for this virus. You are considered at high risk for hepatitis B if:  You were born in a country where hepatitis B is common. Ask your health care provider which countries are considered high risk.  Your parents were born in a high-risk country, and you have not been immunized against hepatitis B (hepatitis B vaccine).  You have HIV or AIDS.  You use needles to inject street drugs.  You live with someone who has hepatitis B.  You have had sex with someone who has hepatitis B.  You get hemodialysis treatment.  You take certain medicines for conditions, including cancer, organ transplantation, and autoimmune conditions. Hepatitis C  Blood testing is recommended for:  Everyone born from 1945 through 1965.  Anyone with known risk factors for hepatitis C. Sexually transmitted infections (STIs)  You should be screened for sexually transmitted infections (STIs) including gonorrhea and chlamydia if:  You are sexually active and are younger than 36 years of age.  You are older than 36 years of age and your health care provider tells you that you are at risk for this type of infection.  Your sexual activity has changed since you were last screened and you are at an increased risk for chlamydia or gonorrhea. Ask your health care provider if you are at risk.  If you do not have HIV, but are at risk, it may be recommended that you take a prescription medicine daily to prevent HIV infection. This is called pre-exposure prophylaxis (PrEP). You are considered at risk if:  You are sexually active and do not regularly use condoms or know the HIV status of your partner(s).  You take drugs by injection.  You are  sexually active with a partner who has HIV. Talk with your health care provider about whether you are at high risk of being infected with HIV. If you choose to begin PrEP, you should first be tested for HIV. You should then be tested every 3 months for as long as you are taking PrEP.  PREGNANCY   If you are premenopausal and you may become pregnant, ask your health care provider about preconception counseling.  If you may become pregnant, take 400 to 800 micrograms (mcg) of folic acid every day.  If you want to prevent pregnancy, talk to your health care provider about birth control (contraception). OSTEOPOROSIS AND MENOPAUSE   Osteoporosis is a disease in which the bones lose minerals and strength with aging. This can result in serious bone fractures. Your risk for osteoporosis can be identified using a bone density scan.  If you are 65 years of age or older, or if you are at risk for osteoporosis and fractures, ask your health care provider if you should be screened.  Ask your health care provider whether you should take a calcium or vitamin D supplement to lower your risk for osteoporosis.  Menopause may have certain physical symptoms and risks.  Hormone replacement therapy may reduce some of these symptoms and risks. Talk to your health care provider about whether hormone replacement therapy is right for you.  HOME CARE INSTRUCTIONS   Schedule regular health, dental, and eye exams.  Stay current with your immunizations.   Do not use any tobacco products including cigarettes, chewing tobacco, or electronic cigarettes.  If you are pregnant, do not drink alcohol.  If you are breastfeeding, limit how much and how often you drink alcohol.  Limit alcohol   intake to no more than 1 drink per day for nonpregnant women. One drink equals 12 ounces of beer, 5 ounces of wine, or 1 ounces of hard liquor.  Do not use street drugs.  Do not share needles.  Ask your health care provider for  help if you need support or information about quitting drugs.  Tell your health care provider if you often feel depressed.  Tell your health care provider if you have ever been abused or do not feel safe at home. Document Released: 03/18/2011 Document Revised: 01/17/2014 Document Reviewed: 08/04/2013 ExitCare Patient Information 2015 [employer]. This information is not intended to replace advice given to you by your health care provider. Make sure you discuss any questions you have with your health care provider.  

## 2016-01-05 NOTE — Progress Notes (Signed)
    [name] 1981/05/28 [id number]        34 y.o.  [id number]  for annual exam.  Several issues noted below.  Past medical history,surgical history, problem list, medications, allergies, family history and social history were all reviewed and documented as reviewed in the EPIC chart.  ROS:  Performed with pertinent positives and negatives included in the history, assessment and plan.   Additional significant findings :  none   Exam: [name] assistant Filed Vitals:   01/05/16 1052  BP: 122/78  Height: 5\' 3"  (1.6 m)  Weight: 274 lb (124.286 kg)   General appearance:  Normal affect, orientation and appearance. Skin: Grossly normal HEENT: Without gross lesions.  No cervical or supraclavicular adenopathy. Thyroid normal.  Lungs:  Clear without wheezing, rales or rhonchi Cardiac: RR, without RMG Abdominal:  Soft, nontender, without masses, guarding, rebound, organomegaly or hernia Breasts:  Examined lying and sitting without masses, retractions, discharge or axillary adenopathy.  Small boil 6:00 position left breast near periphery. Pelvic:  Ext/BUS/vagina normal  Cervix normal with IUD string visualized with endocervical speculum/colposcopy  Uterus anteverted, normal size, shape and contour, midline and mobile nontender   Adnexa without masses or tenderness    Anus and perineum normal   Rectovaginal normal sphincter tone without palpated masses or tenderness.    Assessment/Plan:  35 y.o. [id number] female for annual exam with light regular menses, Mirena IUD.   1. Mirena IUD 02/2012. IUD string visualized. Started having light menses about 6 months ago. Options to replace IUD now to hopefully suppress menses versus maintaining for another year and them replaced at her 5 year mark discussed. Patient will decide. 2. Very strong family history of breast cancer to include mother, maternal grandmother and maternal aunt. Does not believe that they were genetically tested. Recommended  genetic counseling through [facility] and possible genetic testing. Patient wants to do this and will go ahead and make this arrangement. She knows to call me if she does not hear from them within 2 weeks. Also recommended getting a baseline mammogram now she is going to schedule. 3. Small boil left breast. Patient's going to treat symptomatically with warm soaks. If worsen she knows to call for possible antibiotic. 4. Pap smear/HPV 12/2014 negative. No Pap smear done today. No history of significant abnormal Pap smears previously. 5. Health maintenance. No routine lab work done as patient relates this done at her primary physician's office. Follow up for genetic counseling otherwise follow up in one year, sooner as needed   Anastasio Auerbach MD, 11:18 AM 01/05/2016

## 2016-01-05 NOTE — Telephone Encounter (Signed)
-----   Message from Anastasio Auerbach, MD sent at 01/05/2016 11:19 AM EDT ----- Arrange appointment with [facility] genetic counselor reference very strong family history of breast cancer

## 2016-01-05 NOTE — Telephone Encounter (Signed)
Referral placed they will contact pt to schedule. 

## 2016-01-11 ENCOUNTER — Other Ambulatory Visit: Payer: Self-pay

## 2016-01-11 DIAGNOSIS — Z1231 Encounter for screening mammogram for malignant neoplasm of breast: Secondary | ICD-10-CM

## 2016-01-11 DIAGNOSIS — Z803 Family history of malignant neoplasm of breast: Secondary | ICD-10-CM

## 2016-01-15 ENCOUNTER — Telehealth: Payer: Self-pay | Admitting: Genetic Counselor

## 2016-01-15 NOTE — Telephone Encounter (Signed)
Lt mess regarding genetic counseling referral °

## 2016-01-15 NOTE — Telephone Encounter (Signed)
Verified address and insurance, in basket referring office appt date/time, pt confirmed appt date/time and completed intake

## 2016-01-15 NOTE — Telephone Encounter (Signed)
genetic counseling appt for 02/26/16 at 9 am with Roma Kayser.

## 2016-01-16 ENCOUNTER — Encounter: Payer: Self-pay | Admitting: Genetic Counselor

## 2016-01-24 ENCOUNTER — Ambulatory Visit
Admission: RE | Admit: 2016-01-24 | Discharge: 2016-01-24 | Disposition: A | Discharge status: Home / Self Care | Payer: 59 | Source: Ambulatory Visit

## 2016-01-24 DIAGNOSIS — Z1231 Encounter for screening mammogram for malignant neoplasm of breast: Secondary | ICD-10-CM

## 2016-01-24 DIAGNOSIS — Z803 Family history of malignant neoplasm of breast: Secondary | ICD-10-CM

## 2016-02-26 ENCOUNTER — Encounter: Payer: Self-pay | Admitting: Genetic Counselor

## 2016-02-26 ENCOUNTER — Ambulatory Visit (HOSPITAL_BASED_OUTPATIENT_CLINIC_OR_DEPARTMENT_OTHER): Payer: 59 | Admitting: Genetic Counselor

## 2016-02-26 ENCOUNTER — Other Ambulatory Visit: Payer: 59

## 2016-02-26 DIAGNOSIS — Z803 Family history of malignant neoplasm of breast: Secondary | ICD-10-CM

## 2016-02-26 DIAGNOSIS — Z315 Encounter for genetic counseling: Secondary | ICD-10-CM

## 2016-02-26 NOTE — Progress Notes (Signed)
REFERRING PROVIDER: Nolene Ebbs, MD [place], [place] [address]   Donalynn Furlong, MD  PRIMARY PROVIDER:  Philis Fendt, MD  PRIMARY REASON FOR VISIT:  1. Family history of breast cancer      HISTORY OF PRESENT ILLNESS:   Ms. Sek, a 35 y.o. female, was seen for a [facility] cancer genetics consultation at the request of Dr. Phineas Real due to a family history of cancer.  Ms. Ruppert presents to clinic today to discuss the possibility of a hereditary predisposition to cancer, genetic testing, and to further clarify her future cancer risks, as well as potential cancer risks for family members. Ms. Musolino is a 35 y.o. female with no personal history of cancer.    CANCER HISTORY:   No history exists.     HORMONAL RISK FACTORS:  Menarche was at age 26.  First live birth at age 51.  OCP use for approximately 5-6 years.  Ovaries intact: yes.  Hysterectomy: no.  Menopausal status: premenopausal.  HRT use: 0 years. Colonoscopy: no; not examined. Mammogram within the last year: yes. Number of breast biopsies: 0. Up to date with pelvic exams:  yes. Any excessive radiation exposure in the past:  Patient had 7-10 chest X-rays after the birth of her second child due to anxiety and heart palpitations.  Past Medical History  Diagnosis Date  . Pneumonia 2007  . Obesity   . Family history of breast cancer     Past Surgical History  Procedure Laterality Date  . Cesarean section    . Intrauterine device insertion  02/2012    Mirena    Social History   Social History  . Marital Status: Married    Spouse Name: N/A  . Number of Children: 2  . Years of Education: N/A   Occupational History  . Customer Service  At [employer]   Social History Main Topics  . Smoking status: Never Smoker   . Smokeless tobacco: Never Used  . Alcohol Use: No  . Drug Use: No  . Sexual Activity: Yes    Birth Control/ Protection: IUD     Comment: Mirena inserted 02-2012-1st intercourse  35 yo-More than 5 partners   Other Topics Concern  . None   Social History Narrative   Married, children ages 79yo and 60yo, walks for exercise     FAMILY HISTORY:  We obtained a detailed, 4-generation family history.  Significant diagnoses are listed below: Family History  Problem Relation Age of Onset  . Diabetes Mother   . Hypertension Mother   . Breast cancer Mother 16  . Breast cancer Maternal Grandmother 13  . Heart disease Neg Hx   . Stroke Neg Hx   . Breast cancer Other     MGMs sister dx in her 74s    The patient has two daghters who are healthy.  She has maternal half siblings, two sisters and a brother, who are all cancer free.  Her mother was diagnosed with breast cancer around age 42 and had a partial mastectomy.  Her mother has four maternal half sisters and two maternal half brothers who are all cancer free. The patient's maternal grandfather has a brain tumor at 21.  Her grandmother was diagnosed around age 44 with breast cancer and died at 84.  This grandmother had three sisters, one who had breast cancer in her 47s.  The patient's father is alive, but there is no further information about him or his family.  Patient's maternal ancestors  are of African American descent, and paternal ancestors are of African American descent. There is no reported Ashkenazi Jewish ancestry. There is no known consanguinity.  GENETIC COUNSELING ASSESSMENT: JONALYN SEDLAK is a 35 y.o. female with a family history of breast cancer which is somewhat suggestive of a hereditary cancer syndrome and predisposition to cancer. We, therefore, discussed and recommended the following at today's visit.   DISCUSSION: About 5-10% of breast cancer is hereditary with most cases due to BRCA mutations.  Other genes that are seen with hereditary mutations that can increase the risk for breast cancer include PALB2, ATM and CHEK2.  We reviewed the characteristics, features and inheritance patterns of hereditary  cancer syndromes. We also discussed genetic testing, including the appropriate family members to test, the process of testing, insurance coverage and turn-around-time for results. We discussed the implications of a negative, positive and/or variant of uncertain significant result. We recommended Ms. Rampey pursue genetic testing for the Breast/Ovarian cancer gene panel. The Breast/Ovarian gene panel offered by [facility] includes sequencing and rearrangement analysis for the following 20 genes:  ATM, BARD1, BRCA1, BRCA2, BRIP1, CDH1, CHEK2, EPCAM, FANCC, MLH1, MSH2, MSH6, NBN, PALB2, PMS2, PTEN, RAD51C, RAD51D, TP53, and XRCC2.     Based on Ms. Mittelman's personal and family history of cancer, she meets medical criteria for genetic testing. Despite that she meets criteria, she may still have an out of pocket cost. We discussed that if her out of pocket cost for testing is over $100, the laboratory will call and confirm whether she wants to proceed with testing.  If the out of pocket cost of testing is less than $100 she will be billed by the genetic testing laboratory.   In order to estimate her chance of having a BRCA mutation, we used statistical models (Tyrer Cusik and [place] II) and laboratory data that take into account her personal medical history, family history and ancestry.  Because each model is different, there can be a lot of variability in the risks they give.  Therefore, these numbers must be considered a rough range and not a precise risk of having a BRCA mutation.  These models estimate that she has approximately a 1.25-7% chance of having a mutation. Based on this assessment of her family and personal history, genetic testing is recommended.  Based on the patient's personal and family history, statistical models (Tyrer Cusik)  and literature data were used to estimate her risk of developing breast cancer. These estimate her lifetime risk of developing breast cancer to be approximately 22.3%. This  estimation does not take into account any genetic testing results.  The patient's lifetime breast cancer risk is a preliminary estimate based on available information using one of several models endorsed by the [employer] (ACS). The ACS recommends consideration of breast MRI screening as an adjunct to mammography for patients at high risk (defined as 20% or greater lifetime risk). A more detailed breast cancer risk assessment can be considered, if clinically indicated.   Ms. Pall has been determined to be at high risk for breast cancer.  Therefore, we recommend that annual screening with mammography and breast MRI begin at age 83, or 10 years prior to the age of breast cancer diagnosis in a relative (whichever is earlier).  We discussed that Ms. Hinsch should discuss her individual situation with her referring physician and determine a breast cancer screening plan with which they are both comfortable.    PLAN: After considering the risks, benefits, and limitations,  Ms. Askari  provided informed consent to pursue genetic testing and the blood sample was sent to [facility] for analysis of the Breast/Ovarian cancer panel. Results should be available within approximately 2-3 weeks' time, at which point they will be disclosed by telephone to Ms. Mccaffrey, as will any additional recommendations warranted by these results. Ms. Huyser will receive a summary of her genetic counseling visit and a copy of her results once available. This information will also be available in Epic. We encouraged Ms. Pavich to remain in contact with cancer genetics annually so that we can continuously update the family history and inform her of any changes in cancer genetics and testing that may be of benefit for her family. Ms. Jantz questions were answered to her satisfaction today. Our contact information was provided should additional questions or concerns arise.  Lastly, we encouraged Ms. Schiavi to remain in  contact with cancer genetics annually so that we can continuously update the family history and inform her of any changes in cancer genetics and testing that may be of benefit for this family.   Ms.  Hagin questions were answered to her satisfaction today. Our contact information was provided should additional questions or concerns arise. Thank you for the referral and allowing [place] to share in the care of your patient.   Reid Nawrot P. Florene Glen, [place], [facility] Certified Genetic Counselor [email or web address] phone: [phone]  The patient was seen for a total of 45 minutes in face-to-face genetic counseling.  This patient was discussed with Drs. Magrinat, Lindi Adie and/or Burr Medico who agrees with the above.    _______________________________________________________________________ For Office Staff:  Number of people involved in session: 1 Was an Intern/ student involved with case: no

## 2016-03-22 ENCOUNTER — Telehealth: Payer: Self-pay | Admitting: Genetic Counselor

## 2016-03-22 NOTE — Telephone Encounter (Signed)
LM on VM that we need to follow up with her about testing.  We did not have it back, but I would like to speak with her.  Asked that she CB and left phone number.

## 2016-03-25 ENCOUNTER — Telehealth: Payer: Self-pay | Admitting: Genetic Counselor

## 2016-03-25 NOTE — Telephone Encounter (Signed)
Explained that the laboratory provided a BI and found that she would have an OOP cost of $792.60.  She can apply for financial assistance, and I suggested that she call the lab and do that.  If she is not able to bring the cost of testing down below $260 we could go through [facility], another laboratory that offers testing but at a lower cost.  Patient voiced understanding.  I provided the laboratory phone number of [phone].

## 2016-06-19 ENCOUNTER — Encounter: Payer: Self-pay | Admitting: Gynecology

## 2016-06-19 ENCOUNTER — Ambulatory Visit (INDEPENDENT_AMBULATORY_CARE_PROVIDER_SITE_OTHER): Payer: 59 | Admitting: Gynecology

## 2016-06-19 VITALS — BP 124/80

## 2016-06-19 DIAGNOSIS — Z30433 Encounter for removal and reinsertion of intrauterine contraceptive device: Secondary | ICD-10-CM

## 2016-06-19 NOTE — Patient Instructions (Signed)
Intrauterine Device Insertion Most often, an intrauterine device (IUD) is inserted into the uterus to prevent pregnancy. There are 2 types of IUDs available:  Copper IUD--This type of IUD creates an environment that is not favorable to sperm survival. The mechanism of action of the copper IUD is not known for certain. It can stay in place for 10 years.  Hormone IUD--This type of IUD contains the hormone progestin (synthetic progesterone). The progestin thickens the cervical mucus and prevents sperm from entering the uterus, and it also thins the uterine lining. There is no evidence that the hormone IUD prevents implantation. One hormone IUD can stay in place for up to 5 years, and a different hormone IUD can stay in place for up to 3 years. An IUD is the most cost-effective birth control if left in place for the full duration. It may be removed at any time. LET YOUR HEALTH CARE PROVIDER KNOW ABOUT:  Any allergies you have.  All medicines you are taking, including vitamins, herbs, eye drops, creams, and over-the-counter medicines.  Previous problems you or members of your family have had with the use of anesthetics.  Any blood disorders you have.  Previous surgeries you have had.  Possibility of pregnancy.  Medical conditions you have. RISKS AND COMPLICATIONS  Generally, intrauterine device insertion is a safe procedure. However, as with any procedure, complications can occur. Possible complications include:  Accidental puncture (perforation) of the uterus.  Accidental placement of the IUD either in the muscle layer of the uterus (myometrium) or outside the uterus. If this happens, the IUD can be found essentially floating around the bowels and must be taken out surgically.  The IUD may fall out of the uterus (expulsion). This is more common in women who have recently had a child.   Pregnancy in the fallopian tube (ectopic).  Pelvic inflammatory disease (PID), which is infection of  the uterus and fallopian tubes. The risk of PID is slightly increased in the first 20 days after the IUD is placed, but the overall risk is still very low. BEFORE THE PROCEDURE  Schedule the IUD insertion for when you will have your menstrual period or right after, to make sure you are not pregnant. Placement of the IUD is better tolerated shortly after a menstrual cycle.  You may need to take tests or be examined to make sure you are not pregnant.  You may be required to take a pregnancy test.  You may be required to get checked for sexually transmitted infections (STIs) prior to placement. Placing an IUD in someone who has an infection can make the infection worse.  You may be given a pain reliever to take 1 or 2 hours before the procedure.  An exam will be performed to determine the size and position of your uterus.  Ask your health care provider about changing or stopping your regular medicines. PROCEDURE   A tool (speculum) is placed in the vagina. This allows your health care provider to see the lower part of the uterus (cervix).  The cervix is prepped with a medicine that lowers the risk of infection.  You may be given a medicine to numb each side of the cervix (intracervical or paracervical block). This is used to block and control any discomfort with insertion.  A tool (uterine sound) is inserted into the uterus to determine the length of the uterine cavity and the direction the uterus may be tilted.  A slim instrument (IUD inserter) is inserted through the cervical   canal and into your uterus.  The IUD is placed in the uterine cavity and the insertion device is removed.  The nylon string that is attached to the IUD and used for eventual IUD removal is trimmed. It is trimmed so that it lays high in the vagina, just outside the cervix. AFTER THE PROCEDURE  You may have bleeding after the procedure. This is normal. It varies from light spotting for a few days to menstrual-like  bleeding.  You may have mild cramping.   This information is not intended to replace advice given to you by your health care provider. Make sure you discuss any questions you have with your health care provider.   Document Released: 05/01/2011 Document Revised: 06/23/2013 Document Reviewed: 02/21/2013 Elsevier Interactive Patient Education 2016 Elsevier Inc.  

## 2016-06-19 NOTE — Progress Notes (Signed)
    RIKIYAH QUACKENBUSH 1981-07-16 [id number]        35 y.o.  [id number]  presents for Mirena IUD removal and replacement. She is doing this early because she has resumed having light menses and wants to remain amenorrheic. She has read through the booklet, has no contraindications and signed the consent form.  I reviewed the removal and insertional process with her as well as the risks to include infection, either immediate or long-term, uterine perforation or migration requiring surgery to remove, other complications such as pain, hormonal side effects and possibility of failure with subsequent pregnancy.   Exam with [name] assistant Vitals:   06/19/16 0834  BP: 124/80    Pelvic: External BUS vagina normal. Cervix normal with IUD string within external os. Uterus grossly normal size midline mobile nontender. Adnexa without masses or tenderness.  Procedure: The cervix was visualized with a speculum and the IUD string was grasped with the [facility] forcep, removed, shown to the patient and discarded. The cervix was then cleansed with Betadine, anterior lip grasped with a single-tooth tenaculum, the uterus was sounded and a new Mirena IUD was placed according to manufacturer's recommendations without difficulty. The strings were trimmed. The patient tolerated well and will follow up in one month for a postinsertional check.  Lot number:  Shaaron Adler MD, 8:49 AM 06/19/2016

## 2016-07-26 ENCOUNTER — Ambulatory Visit: Payer: 59 | Admitting: Gynecology

## 2017-01-17 ENCOUNTER — Encounter: Payer: Self-pay | Admitting: Gynecology

## 2017-01-17 ENCOUNTER — Ambulatory Visit (INDEPENDENT_AMBULATORY_CARE_PROVIDER_SITE_OTHER): Payer: 59 | Admitting: Gynecology

## 2017-01-17 VITALS — BP 134/84 | Ht 63.0 in | Wt 271.0 lb

## 2017-01-17 DIAGNOSIS — N921 Excessive and frequent menstruation with irregular cycle: Secondary | ICD-10-CM | POA: Diagnosis not present

## 2017-01-17 DIAGNOSIS — Z30431 Encounter for routine checking of intrauterine contraceptive device: Secondary | ICD-10-CM | POA: Diagnosis not present

## 2017-01-17 DIAGNOSIS — Z01419 Encounter for gynecological examination (general) (routine) without abnormal findings: Secondary | ICD-10-CM

## 2017-01-17 NOTE — Patient Instructions (Signed)
Follow up for ultrasound as scheduled 

## 2017-01-17 NOTE — Progress Notes (Signed)
    [name] 1981/03/11 [id number]        35 y.o.  [id number] for annual exam.  Notes since replacement of her IUD 06/2016 her menses are heavy lasting 2 weeks. No bleeding in between otherwise. No significant pain or cramping. Never followed up with a postinsertional check up.  Past medical history,surgical history, problem list, medications, allergies, family history and social history were all reviewed and documented as reviewed in the EPIC chart.  ROS:  Performed with pertinent positives and negatives included in the history, assessment and plan.   Additional significant findings :  None   Exam: [name] assistant Vitals:   01/17/17 1009  BP: 134/84  Weight: 271 lb (122.9 kg)  Height: 5\' 3"  (1.6 m)   Body mass index is 48.01 kg/m.  General appearance:  Normal affect, orientation and appearance. Skin: Grossly normal HEENT: Without gross lesions.  No cervical or supraclavicular adenopathy. Thyroid normal.  Lungs:  Clear without wheezing, rales or rhonchi Cardiac: RR, without RMG Abdominal:  Soft, nontender, without masses, guarding, rebound, organomegaly or hernia Breasts:  Examined lying and sitting without masses, retractions, discharge or axillary adenopathy. Pelvic:  Ext, BUS, Vagina: Normal  Cervix: Normal with IUD string visualized  Uterus: Anteverted, normal size, shape and contour, midline and mobile nontender   Adnexa: Without masses or tenderness    Anus and perineum: Normal   Rectovaginal: Normal sphincter tone without palpated masses or tenderness.    Assessment/Plan:  36 y.o. [id number] female for annual exam with heavy monthly menses, Mirena IUD.   1. Menorrhagia with Mirena IUD. IUD string visualized in appropriate length. Options to poorly IUD now versus ultrasound to look for placement and other pathology discussed. Options to replace the IUD since she did have a good experience with her first IUD also reviewed. Patient would prefer ultrasound first and  then making a decision as far as replacement and she will schedule this in follow up for this. 2. Strong family history of breast cancer including mother, maternal grandmother and maternal aunt. Was counseled at [facility]. Her out-of-pocket was going to be high and she ultimately decided not to have it done. I again reviewed with her the options to do this now in revisited. Possible increased risks of breast cancer/ovarian cancer if she was genetically positive reviewed. At this point the patient is not interested. She will follow up if she changes her mind. I did recommend starting annual mammography is now she's going to schedule issues coming due this month for her mammogram. SBE monthly reviewed. 3. Pap smear/HPV 12/2014. No Pap smear done today. No history of significant abnormal Pap smears. Plan repeat Pap smear at 5 year interval per current screening guidelines. 4. Health maintenance. No routine lab work done as patient reports this done elsewhere. Mild elevated blood pressure at 134/84 noted. Patient will follow up with her primary at her next visit to have rechecked. Follow up ultrasound. Follow up in one year for annual exam area follow up if she changes her mind about genetic testing.   Anastasio Auerbach MD, 10:43 AM 01/17/2017

## 2017-01-27 ENCOUNTER — Emergency Department (HOSPITAL_COMMUNITY): Payer: 59

## 2017-01-27 ENCOUNTER — Emergency Department (HOSPITAL_COMMUNITY)
Admission: EM | Admit: 2017-01-27 | Discharge: 2017-01-27 | Disposition: A | Discharge status: Home / Self Care | Payer: 59 | Attending: Emergency Medicine | Admitting: Emergency Medicine

## 2017-01-27 ENCOUNTER — Encounter (HOSPITAL_COMMUNITY): Payer: Self-pay | Admitting: Emergency Medicine

## 2017-01-27 DIAGNOSIS — R200 Anesthesia of skin: Secondary | ICD-10-CM | POA: Insufficient documentation

## 2017-01-27 DIAGNOSIS — R0602 Shortness of breath: Secondary | ICD-10-CM | POA: Insufficient documentation

## 2017-01-27 LAB — BASIC METABOLIC PANEL
Anion gap: 6 (ref 5–15)
BUN: 6 mg/dL (ref 6–20)
CALCIUM: 9 mg/dL (ref 8.9–10.3)
CO2: 26 mmol/L (ref 22–32)
Chloride: 106 mmol/L (ref 101–111)
Creatinine, Ser: 0.69 mg/dL (ref 0.44–1.00)
GFR calc non Af Amer: >60 mL/min (ref >60)
Glucose, Bld: 100 mg/dL — ABNORMAL HIGH (ref 65–99)
Potassium: 3.6 mmol/L (ref 3.5–5.1)
SODIUM: 138 mmol/L (ref 135–145)

## 2017-01-27 LAB — CBC
HCT: 38 % (ref 36.0–46.0)
HEMOGLOBIN: 12.7 g/dL (ref 12.0–15.0)
MCH: 26.8 pg (ref 26.0–34.0)
MCHC: 33.4 g/dL (ref 30.0–36.0)
MCV: 80.2 fL (ref 78.0–100.0)
Platelets: 424 10*3/uL — ABNORMAL HIGH (ref 150–400)
RBC: 4.74 MIL/uL (ref 3.87–5.11)
RDW: 12.9 % (ref 11.5–15.5)
WBC: 3.4 10*3/uL — ABNORMAL LOW (ref 4.0–10.5)

## 2017-01-27 LAB — I-STAT TROPONIN, ED: TROPONIN I, POC: 0 ng/mL (ref 0.00–0.08)

## 2017-01-27 MED ORDER — IPRATROPIUM-ALBUTEROL 0.5-2.5 (3) MG/3ML IN SOLN
3.0000 mL | Freq: Once | RESPIRATORY_TRACT | Status: AC
  Administered 2017-01-27: 3 mL via RESPIRATORY_TRACT
  Filled 2017-01-27: qty 3

## 2017-01-27 NOTE — Discharge Instructions (Signed)
Please follow up with your doctor °Return for worsening symptoms °

## 2017-01-27 NOTE — ED Provider Notes (Signed)
[facility] DEPT Provider Note   CSN: [id number] Arrival date & time: 01/27/17  0825     History   Chief Complaint Chief Complaint  Patient presents with  . Numbness  . Shortness of Breath    HPI [name] is a 36 y.o. female who presents with shortness of breath and left arm numbness. She states that she was folding clothes this morning sitting down when she felt acutely short of breath. Nothing made it better or worse. She felt associated left arm numbness from the shoulder to the fingertips. She has never experienced symptoms like this before. She reports associated chest tightness on the left side. She did have an episode of shortness of breath several years ago when she was evaluated in the ED however this was attributed to a new medication phentermine which after she stopped using this medicine she didn't have any recurrent shortness of breath. She denies recent fever or illness, chest pain, cough, wheezing, history of asthma or smoking, abdominal pain, nausea or vomiting, leg swelling or calf pain. No recent surgery/travel/immobilization, hx of cancer, hemoptysis. She does have Mirena IUD. Also has been having allergy symptoms with increased phlegm production. She also reports chronic neck pain but denies having numbness in her arm in the past or arm weakness.   HPI  Past Medical History:  Diagnosis Date  . Obesity   . Pneumonia 2007    Patient Active Problem List   Diagnosis Date Noted  . Family history of breast cancer   . Need for Tdap vaccination 08/06/2011  . Obesity 08/06/2011    Past Surgical History:  Procedure Laterality Date  . CESAREAN SECTION    . INTRAUTERINE DEVICE INSERTION  06/2016   Mirena    OB History    Gravida Para Term Preterm AB Living   3 2 2   1 2    SAB TAB Ectopic Multiple Live Births     1             Home Medications    Prior to Admission medications   Not on File    Family History Family History  Problem Relation  Age of Onset  . Diabetes Mother   . Hypertension Mother   . Breast cancer Mother 75  . Breast cancer Maternal Grandmother 102  . Heart disease Neg Hx   . Stroke Neg Hx     Social History Social History  Substance Use Topics  . Smoking status: Never Smoker  . Smokeless tobacco: Never Used  . Alcohol use No     Allergies   Patient has no known allergies.   Review of Systems Review of Systems  Constitutional: Negative for chills and fever.  Respiratory: Positive for chest tightness and shortness of breath. Negative for cough and wheezing.   Cardiovascular: Negative for chest pain, palpitations and leg swelling.  Gastrointestinal: Negative for abdominal pain, nausea and vomiting.  Musculoskeletal: Positive for neck pain (chronic).  Allergic/Immunologic: Positive for environmental allergies.  Neurological: Positive for numbness. Negative for syncope and light-headedness.  All other systems reviewed and are negative.    Physical Exam Updated Vital Signs BP (!) 124/103   Pulse 86   Temp 98.1 F (36.7 C) (Oral)   Resp 12   LMP 12/31/2016   SpO2 99%   Physical Exam  Constitutional: She is oriented to person, place, and time. She appears well-developed and well-nourished. No distress.  Obese, pleasant, no acute distress  HENT:  Head: Normocephalic and atraumatic.  Right Ear: Hearing, external ear and ear canal normal.  Left Ear: Hearing, tympanic membrane, external ear and ear canal normal.  Nose: Nose normal.  Mouth/Throat: Uvula is midline, oropharynx is clear and moist and mucous membranes are normal.  Cerumen impaction on the right  Eyes: Conjunctivae are normal. Pupils are equal, round, and reactive to light. Right eye exhibits no discharge. Left eye exhibits no discharge. No scleral icterus.  Neck: Normal range of motion.  No neck tenderness  Cardiovascular: Normal rate and regular rhythm.  Exam reveals no gallop and no friction rub.   No murmur  heard. Pulmonary/Chest: Effort normal and breath sounds normal. No respiratory distress. She has no wheezes. She has no rales. She exhibits no tenderness.  Abdominal: Soft. Bowel sounds are normal. She exhibits no distension. There is no tenderness.  Musculoskeletal:  No numbness and tingling of the left arm. 5 out of 5 strength  No calf swelling or tenderness  Neurological: She is alert and oriented to person, place, and time.  Skin: Skin is warm and dry.  Psychiatric: She has a normal mood and affect. Her behavior is normal.  Nursing note and vitals reviewed.    ED Treatments / Results  Labs (all labs ordered are listed, but only abnormal results are displayed) Labs Reviewed  BASIC METABOLIC PANEL - Abnormal; Notable for the following:       Result Value   Glucose, Bld 100 (*)    All other components within normal limits  CBC - Abnormal; Notable for the following:    WBC 3.4 (*)    Platelets 424 (*)    All other components within normal limits  I-STAT TROPOININ, ED    EKG  EKG Interpretation None       Radiology Dg Chest 2 View  Result Date: 01/27/2017 CLINICAL DATA:  Chest pain, left arm numbness EXAM: CHEST  2 VIEW COMPARISON:  11/28/2014 FINDINGS: The heart size and mediastinal contours are within normal limits. Both lungs are clear. The visualized skeletal structures are unremarkable. IMPRESSION: No active cardiopulmonary disease. Electronically Signed   By: Rolm Baptise M.D.   On: 01/27/2017 09:10    Procedures Procedures (including critical care time)  Medications Ordered in ED Medications  ipratropium-albuterol (DUONEB) 0.5-2.5 (3) MG/3ML nebulizer solution 3 mL (3 mLs Nebulization Given 01/27/17 1031)     Initial Impression / Assessment and Plan / ED Course  I have reviewed the triage vital signs and the nursing notes.  Pertinent labs & imaging results that were available during my care of the patient were reviewed by me and considered in my medical  decision making (see chart for details).  36 year old female with shortness of breath and intermittent numbness of the left arm which has resolved. She is mildly hypertensive but otherwise vital signs are normal. No respiratory distress. Heart is regular rate and rhythm and lungs are clear. CBC remarkable for mild leukopenia and elevated platelets. EKG is normal sinus rhythm. Troponin is normal. Chest x-ray is negative.  After breathing treatment patient reports significant improvement in her symptoms. She has no recurrent numbness of her arm. She has appointment with her primary care doctor tomorrow. She is not had any respiratory distress or hypoxia. We'll discharge with close follow-up. Return precautions given.  Final Clinical Impressions(s) / ED Diagnoses   Final diagnoses:  Shortness of breath  Numbness of arm    New Prescriptions New Prescriptions   No medications on file     Recardo Evangelist, PA-C  01/31/17 1502    Forde Dandy, MD 01/31/17 2159

## 2017-01-27 NOTE — ED Triage Notes (Signed)
Pt reports left arm numbness that comes in goes x1 hours as well as some tightness in her chest and sob, no neuro symptoms other than sensation deficit in upper left arm only. Speech clear, grip strength equal bilaterally. Dr. Oleta Mouse made aware of situation and in to assess patient in triage. Dr. Oleta Mouse advised code stroke does not need to be called at this time.

## 2017-01-29 ENCOUNTER — Other Ambulatory Visit: Payer: 59

## 2017-01-29 ENCOUNTER — Ambulatory Visit: Payer: 59 | Admitting: Gynecology

## 2017-03-12 ENCOUNTER — Ambulatory Visit (INDEPENDENT_AMBULATORY_CARE_PROVIDER_SITE_OTHER): Payer: 59 | Admitting: Gynecology

## 2017-03-12 ENCOUNTER — Ambulatory Visit (INDEPENDENT_AMBULATORY_CARE_PROVIDER_SITE_OTHER): Payer: 59

## 2017-03-12 ENCOUNTER — Other Ambulatory Visit: Payer: Self-pay | Admitting: Gynecology

## 2017-03-12 ENCOUNTER — Encounter: Payer: Self-pay | Admitting: Gynecology

## 2017-03-12 VITALS — BP 122/80

## 2017-03-12 DIAGNOSIS — D251 Intramural leiomyoma of uterus: Secondary | ICD-10-CM

## 2017-03-12 DIAGNOSIS — N939 Abnormal uterine and vaginal bleeding, unspecified: Secondary | ICD-10-CM

## 2017-03-12 DIAGNOSIS — N920 Excessive and frequent menstruation with regular cycle: Secondary | ICD-10-CM | POA: Diagnosis not present

## 2017-03-12 DIAGNOSIS — N852 Hypertrophy of uterus: Secondary | ICD-10-CM | POA: Diagnosis not present

## 2017-03-12 DIAGNOSIS — N921 Excessive and frequent menstruation with irregular cycle: Secondary | ICD-10-CM

## 2017-03-12 DIAGNOSIS — Z30431 Encounter for routine checking of intrauterine contraceptive device: Secondary | ICD-10-CM

## 2017-03-12 DIAGNOSIS — D259 Leiomyoma of uterus, unspecified: Secondary | ICD-10-CM | POA: Diagnosis not present

## 2017-03-12 MED ORDER — MEGESTROL ACETATE 20 MG PO TABS: 20.0000 mg | ORAL_TABLET | Freq: Every day | ORAL | 1 refills | Status: DC

## 2017-03-12 NOTE — Patient Instructions (Signed)
Take the Megace pill for the first 5 days of your menses. Hopefully this will lighten up your bleeding. Follow up if it does not seem to be working.

## 2017-03-12 NOTE — Progress Notes (Signed)
    [name] Oct 23, 1980 [id number]        35 y.o.  [id number] presents for ultrasound. Had Mirena IUD replaced 06/2016 and has continued to have heavy menses since then. No intermenstrual bleeding.  Past medical history,surgical history, problem list, medications, allergies, family history and social history were all reviewed and documented in the EPIC chart.  Directed ROS with pertinent positives and negatives documented in the history of present illness/assessment and plan.  Exam: Vitals:   03/12/17 0944  BP: 122/80   General appearance:  Normal  Ultrasound transvaginal and transabdominal shows uterus enlarged with several myomas measuring 64 mm, 39 mm, 22 mm, 18 mm. Endometrial echo 3.5 mm. IUD visualized and in normal position. Right ovary not visualized.  No right adnexal pathology noted. Left ovary normal. Cul-de-sac negative.  Assessment/Plan:  36 y.o. [id number] with heavier menses since replacing her Mirena IUD. Multiple leiomyoma as described above. Options for management reviewed to include hormonal manipulation, replacement of her IUD, surgery such as myomectomy/hysterectomy. At this point will try hormonal manipulation with Megace 20 mg by mouth daily for the first 5 days of each month. We'll see if this doesn't lighten her menses. If acceptable than she will continue this through next year. If heavy bleeding continues or irregular bleeding occurs and she'll follow up for further evaluation.    Anastasio Auerbach MD, 10:04 AM 03/12/2017

## 2017-03-13 ENCOUNTER — Telehealth: Payer: Self-pay | Admitting: *Deleted

## 2017-03-13 MED ORDER — TRAMADOL HCL 50 MG PO TABS: 50.0000 mg | ORAL_TABLET | Freq: Four times a day (QID) | ORAL | 0 refills | Status: DC | PRN

## 2017-03-13 NOTE — Telephone Encounter (Signed)
Pt had ultrasound yesterday for IUD check up, pt said she has been cramping since ultrasound taking OTC ibuprofen and tylenol and no relief, pt asked if Rx could be sent to help with cramping? Please advise

## 2017-03-13 NOTE — Telephone Encounter (Signed)
Tramadol 50 mg #10 one by mouth every 6 hours when necessary for pain, no refill

## 2017-03-13 NOTE — Telephone Encounter (Signed)
Left on voicemail Rx has been called in

## 2017-09-23 ENCOUNTER — Ambulatory Visit (INDEPENDENT_AMBULATORY_CARE_PROVIDER_SITE_OTHER): Payer: Commercial Managed Care - PPO | Admitting: Gynecology

## 2017-09-23 ENCOUNTER — Encounter: Payer: Self-pay | Admitting: Gynecology

## 2017-09-23 VITALS — BP 124/80

## 2017-09-23 DIAGNOSIS — N921 Excessive and frequent menstruation with irregular cycle: Secondary | ICD-10-CM

## 2017-09-23 DIAGNOSIS — Z30432 Encounter for removal of intrauterine contraceptive device: Secondary | ICD-10-CM

## 2017-09-23 DIAGNOSIS — D259 Leiomyoma of uterus, unspecified: Secondary | ICD-10-CM | POA: Diagnosis not present

## 2017-09-23 MED ORDER — NORETHINDRONE ACET-ETHINYL EST 1-20 MG-MCG PO TABS: 1.0000 | ORAL_TABLET | Freq: Every day | ORAL | 4 refills | Status: DC

## 2017-09-23 NOTE — Patient Instructions (Signed)
Start on the birth control pills as we discussed

## 2017-09-23 NOTE — Progress Notes (Signed)
    [name] 19-Nov-1980 [id number]        36 y.o.  [id number] presents complaining of bleeding on and off throughout December.  History of heavy menses and known leiomyoma.  Had IUD placed 06/2016 and continued to have been heavy monthly menses despite this.  Megace 20 mg twice daily for several days of her menses was added which seemed to help but in December she bled on and off throughout the entire month.  Is to have her IUD removed and is thinking about starting on oral contraceptives to see if this would not help.  Past medical history,surgical history, problem list, medications, allergies, family history and social history were all reviewed and documented in the EPIC chart.  Directed ROS with pertinent positives and negatives documented in the history of present illness/assessment and plan.  Exam: [name] assistant Vitals:   09/23/17 1620  BP: 124/80   General appearance:  Normal Abdomen soft nontender without masses guarding rebound Pelvic external BUS vagina normal.  IUD string visualized.  Uterus grossly normal midline mobile nontender.  Adnexa without masses or tenderness. Procedure: The cervix was visualized with a speculum, the IUD string was grasped with a [place] forcep and her Mirena IUD was removed, shown to the patient and discarded.  Assessment/Plan:  37 y.o. [name] with continued heavy bleeding with IUD.  Bled on and off throughout the month of December.  Wants to try oral contraceptives.  Had used a number of years ago without problems.  Never smoked and not being followed for hypertension or diabetes.  Risks to include stroke heart attack DVT were reviewed with patient.  We will go ahead and start on Loestrin 1/20 equivalent and see how she does.  If she does well after 1 or 2 packs then the option for every other month to every third month withdrawal also discussed.  Will call if she continues to have abnormal bleeding.  At this point she is not ready to proceed  with myomectomy or hysterectomy as a surgical treatment.  Not sure if childbearing is over and do not at this point feel uterine artery embolization appropriate.  Is due for her annual exam in May and will follow-up at that time and will see how she is doing but will call sooner if she continues to have significantly abnormal bleeding.    Felicia Auerbach MD, 4:44 PM 09/23/2017

## 2017-09-24 ENCOUNTER — Telehealth: Payer: Self-pay

## 2017-09-24 NOTE — Telephone Encounter (Signed)
I cannot see how this could possibly be related to her IUD removal.  I think that she would need to see another physician either per primary physician or urgent care

## 2017-09-24 NOTE — Telephone Encounter (Signed)
Had IUD removed yesterday. Awoke this morning with blurry vision up close and distance.  She asked if any possiblity related to removal yesterday or she needs to see someone else today about this.

## 2017-09-24 NOTE — Telephone Encounter (Signed)
Left detailed message in her voice mail. Asked her to call me if I can be of any further assistance and left direct phone number.

## 2017-10-04 IMAGING — MG MM DIGITAL SCREENING BILAT
5 series · 5 of 5 positions shown · non-contrast
Comparison: None.

CLINICAL DATA: Screening.

EXAM:
DIGITAL SCREENING BILATERAL MAMMOGRAM WITH CAD

[R MLO (1 of 2)]
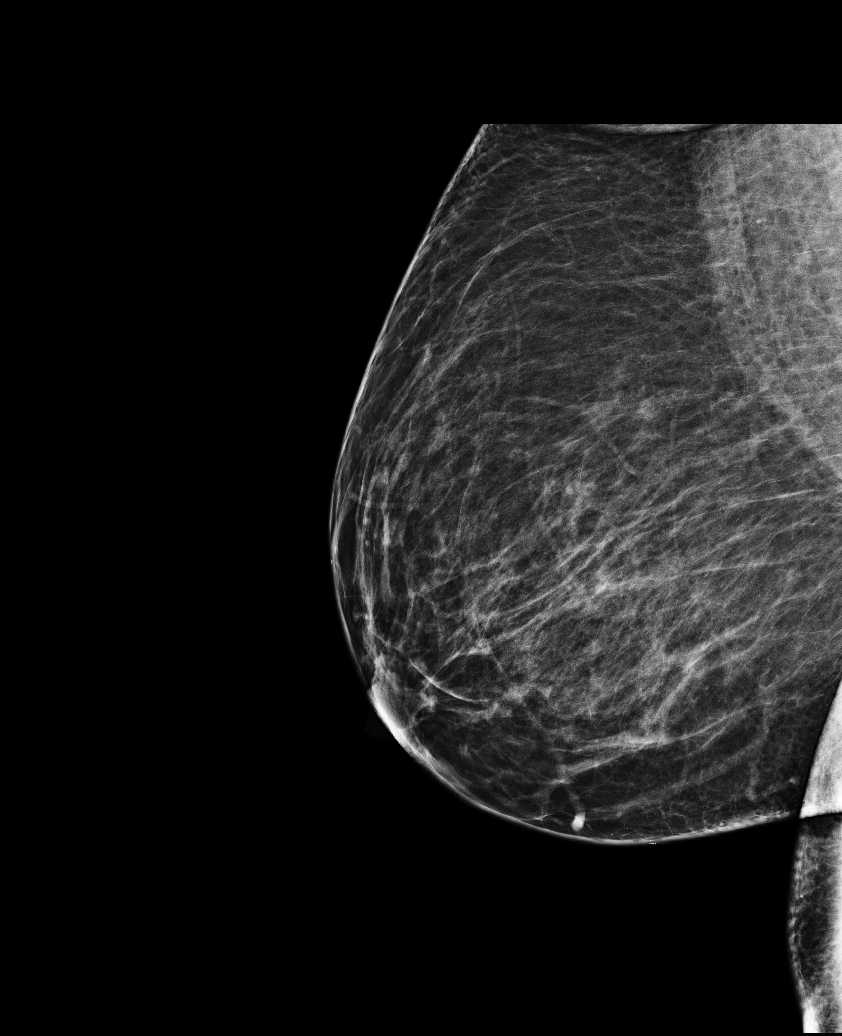

[R MLO (2 of 2)]
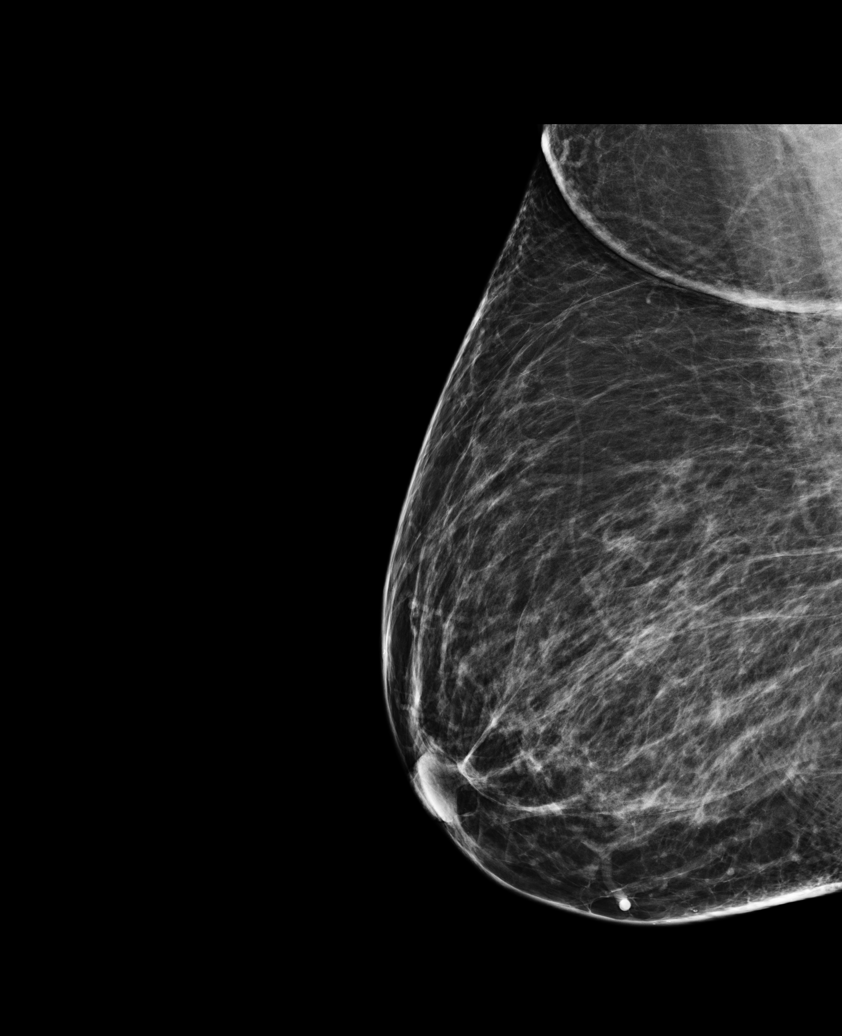

[L MLO]
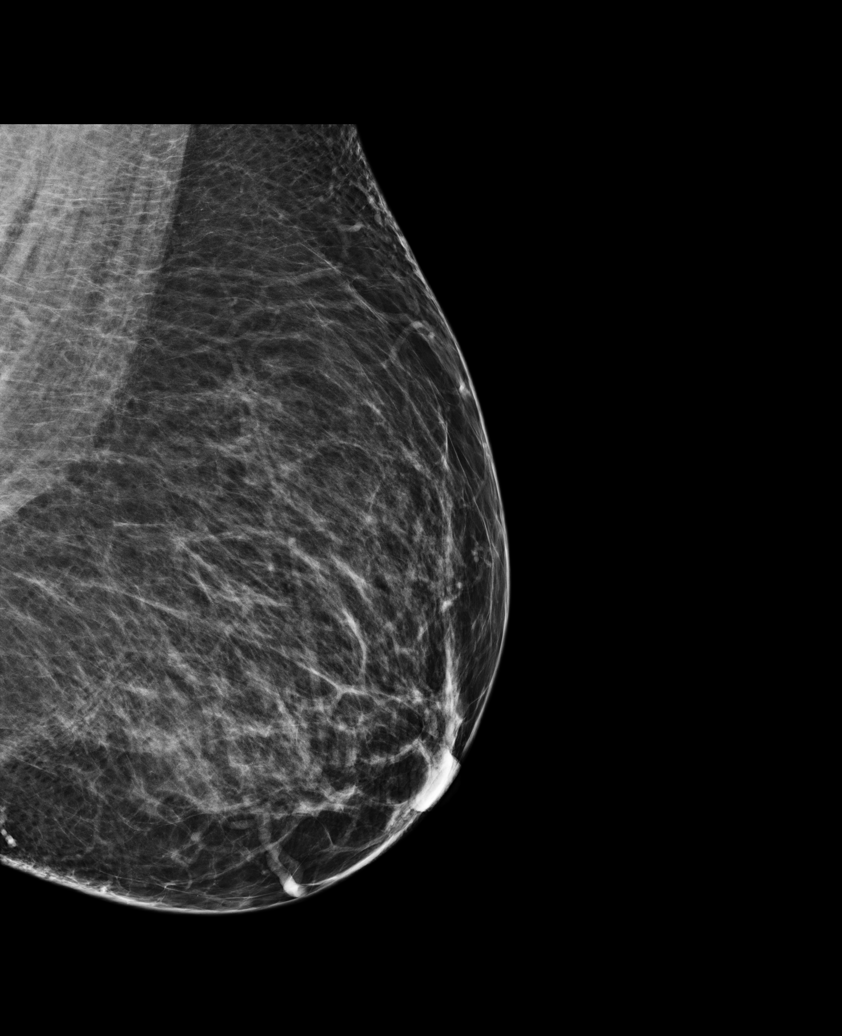

[R CC]
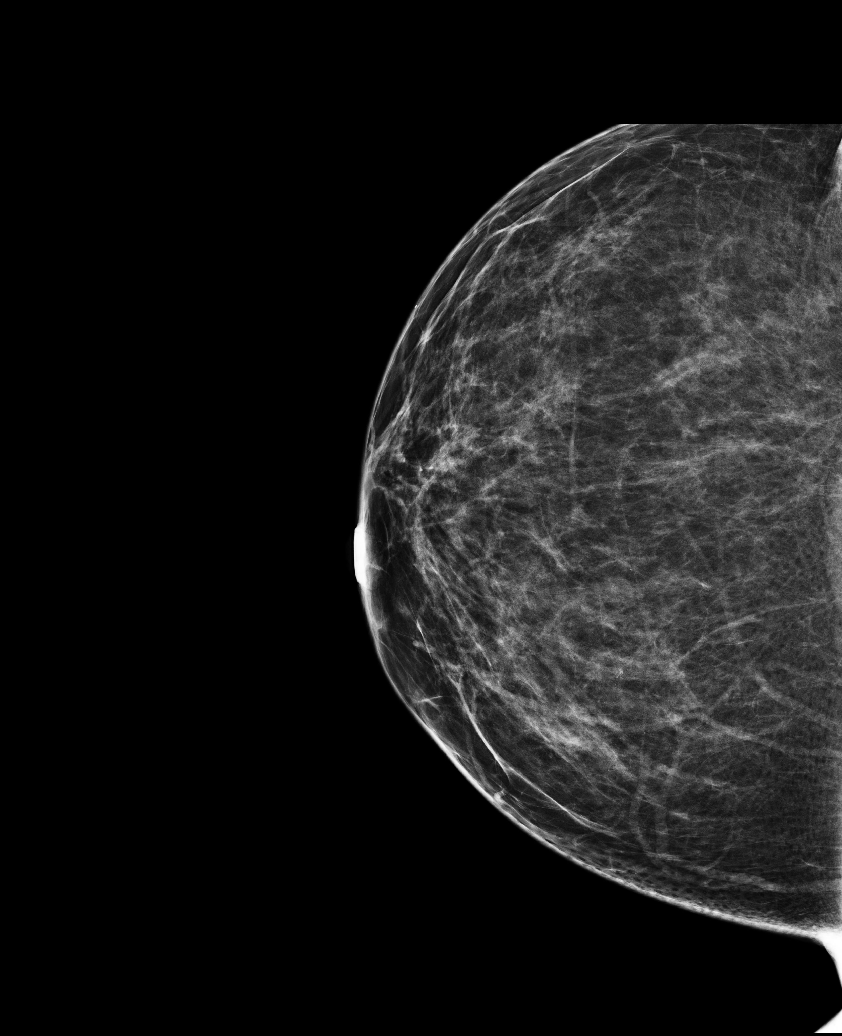

[L CC]
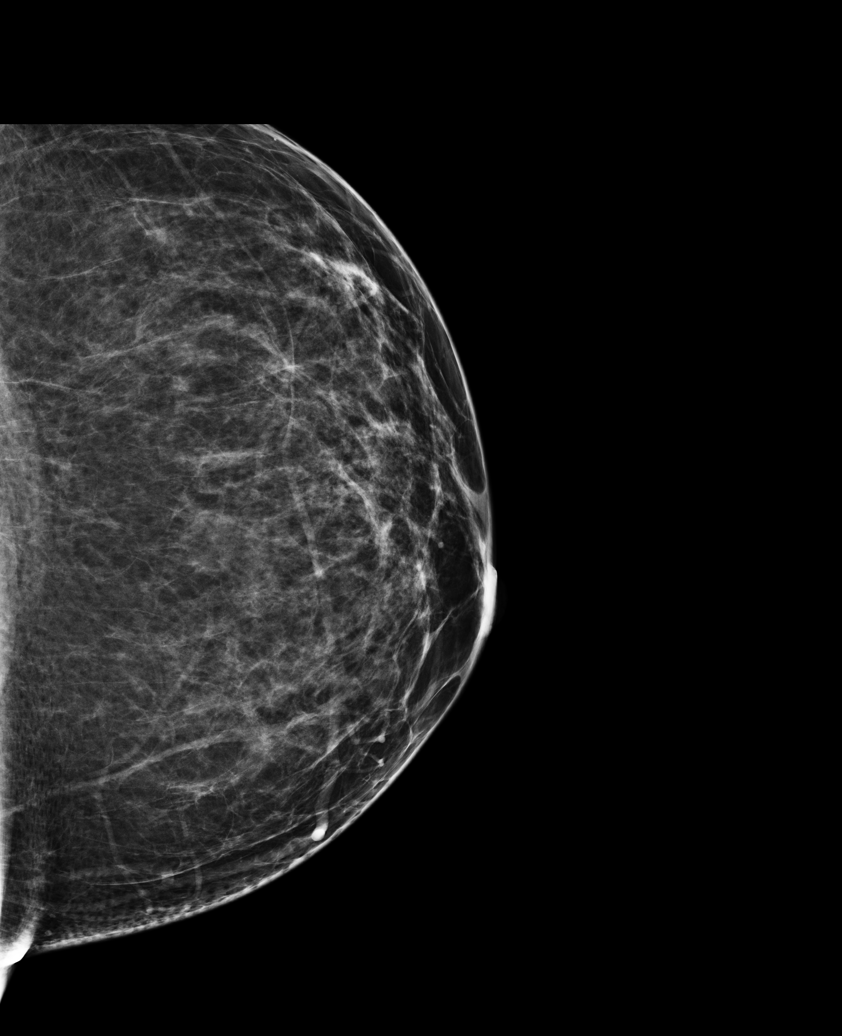

[5 of 5 positions shown; findings below may reference images not displayed]

ACR Breast Density Category b: There are scattered areas of
fibroglandular density.
FINDINGS: There are no findings suspicious for malignancy. Images were
processed with CAD.
IMPRESSION: No mammographic evidence of malignancy. A result letter of this
screening mammogram will be mailed directly to the patient.

RECOMMENDATION:
Screening mammogram at age 39. (Code:[id number])

BI-RADS CATEGORY  1: Negative.

## 2017-10-09 ENCOUNTER — Telehealth: Payer: Self-pay | Admitting: *Deleted

## 2017-10-09 NOTE — Telephone Encounter (Signed)
Patient called stating she has thick blood discharge yesterday, patient appeared to similar to blood clot, slight cramping prior to thick discharge, light flow now on birth control pills, Mirena Iud removed on 09/23/17, takes pills daily, no vaginal symptoms. No cramping/pain now, states brownish blood, will watch and follow up if needed.

## 2017-10-10 ENCOUNTER — Telehealth: Payer: Self-pay | Admitting: *Deleted

## 2017-10-10 ENCOUNTER — Encounter: Payer: Self-pay | Admitting: *Deleted

## 2017-10-10 MED ORDER — MEGESTROL ACETATE 20 MG PO TABS: 20.0000 mg | ORAL_TABLET | Freq: Every day | ORAL | 0 refills | Status: DC

## 2017-10-10 NOTE — Telephone Encounter (Signed)
Left detailed message on # and sent my chart as well.

## 2017-10-10 NOTE — Telephone Encounter (Signed)
Patient called at 3:45pm c/o passing blood clots, taking birth control pills for contraception, had Mirena IUD removed, had brownish discharge on yesterday and cycle started last night, in active row of pills. Wearing pads changing every 2 hours, cramps, states she started passing big clots and today, aware OV needed, asked if the clots could be related to birth control pills, states the clots is a new problem. Not able to come in today due to time and she works out of town. Asked if she should stop pills,and monitor? Otherwise she will schedule visit next week. Please advise

## 2017-10-10 NOTE — Telephone Encounter (Signed)
I would stay on the pills.  If she has Megace at home I would also supplement with Megace 20 mg daily.  If not then call in Megace 20 mg #30 one p.o. daily as needed for bleeding.  Assuming her bleeding goes away then continue on the birth control pills and going to another pack and will see if this does not override her system.

## 2017-10-13 ENCOUNTER — Telehealth: Payer: Self-pay | Admitting: *Deleted

## 2017-10-13 NOTE — Telephone Encounter (Signed)
Left detailed message on cell.

## 2017-10-13 NOTE — Telephone Encounter (Signed)
Stop the BCPs

## 2017-10-13 NOTE — Telephone Encounter (Signed)
Patient called to follow up from previous telephone encounter on 10/10/17. Pt said the megace helps in the morning with help decreasing blood clots, but by the afternoon the clots continue to come out (I can feel them coming out) per patient. States she would like to stop the birth control pills, since prior to pills she was not having clots, makes her dizzy as well. Patient said she is only taking pills for birth control. Please advise

## 2017-10-15 ENCOUNTER — Encounter: Payer: Self-pay | Admitting: Gynecology

## 2017-10-15 ENCOUNTER — Ambulatory Visit (INDEPENDENT_AMBULATORY_CARE_PROVIDER_SITE_OTHER): Payer: Commercial Managed Care - PPO | Admitting: Gynecology

## 2017-10-15 VITALS — BP 134/90

## 2017-10-15 DIAGNOSIS — D259 Leiomyoma of uterus, unspecified: Secondary | ICD-10-CM | POA: Diagnosis not present

## 2017-10-15 DIAGNOSIS — N921 Excessive and frequent menstruation with irregular cycle: Secondary | ICD-10-CM

## 2017-10-15 MED ORDER — MEGESTROL ACETATE 20 MG PO TABS: 20.0000 mg | ORAL_TABLET | Freq: Three times a day (TID) | ORAL | 1 refills | Status: DC

## 2017-10-15 NOTE — Patient Instructions (Signed)
Take the Megace tablets 3 times daily.  If you decide to pursue another IUD then stay on the Megace until the IUD is placed.  If you decide not to have an IUD then take the Megace for 10 days and then stop it.  Call us and we will prescribe the medication Lysteda that you take 2 tablets 3 times a day for the first 5 days of your period that will lighten your bleeding.

## 2017-10-15 NOTE — Progress Notes (Signed)
    [name] [birth year] [id number]        36 y.o.  [id number] presents with persistent bleeding.  History of menorrhagia and leiomyoma.  Had Mirena IUD and did well from a lighter menses standpoint.  Her IUD was replaced 06/2016.  Since then she had heavier bleeding on and off.  Ultrasound 02/2017 showed IUD in place with several myomas noted at 64 mm, 39 mm, 22 mm and 18 mm.  She recently was treated with Megace with her IUD and then had her IUD removed.  Was started on low-dose oral contraceptives beginning of this month but continued to bleed on and off, stop the pills and now presents with ongoing bleeding.  Past medical history,surgical history, problem list, medications, allergies, family history and social history were all reviewed and documented in the EPIC chart.  Directed ROS with pertinent positives and negatives documented in the history of present illness/assessment and plan.  Exam: [name] assistant Vitals:   10/15/17 1509  BP: 134/90   General appearance:  Normal Abdomen soft nontender without masses guarding rebound Pelvic external BUS vagina with scant bleeding.  Cervix normal.  Uterus difficult to palpate, midline mobile nontender.  Adnexa without gross masses or tenderness.  Assessment/Plan:  37 y.o. [id number] with persistent irregular bleeding.  Leiomyoma.  Currently not sexually active.  Options for management reviewed to include increasing Megace for now at 20 mg 3 times daily.  Take for 10 days then stop and use Megace intermittently with menses or consider Lysteda to help control her bleeding.  Alternatives include replacement of her IUD to see if she does not do better with a third IUD.  She did well with the first 1 and then had a bad experience with the second 1.  If she chooses IUD then recommend staying on Megace 20 mg 3 times daily till placement.  Will check baseline CBC today and hCG although patient relates not being sexually active at this time while she is  bleeding.  I have asked her not to be sexually active until resolved her issue.    Felicia Auerbach MD, 3:41 PM 10/15/2017

## 2017-10-16 LAB — CBC WITH DIFFERENTIAL/PLATELET
BASOS ABS: 29 {cells}/uL (ref 0–200)
Basophils Relative: 0.5 %
EOS PCT: 1.4 %
Eosinophils Absolute: 81 cells/uL (ref 15–500)
HEMATOCRIT: 34.7 % — AB (ref 35.0–45.0)
HEMOGLOBIN: 11.3 g/dL — AB (ref 11.7–15.5)
LYMPHS ABS: 2187 {cells}/uL (ref 850–3900)
MCH: 26.4 pg — ABNORMAL LOW (ref 27.0–33.0)
MCHC: 32.6 g/dL (ref 32.0–36.0)
MCV: 81.1 fL (ref 80.0–100.0)
MPV: 10.6 fL (ref 7.5–12.5)
Monocytes Relative: 6.2 %
NEUTROS ABS: 3144 {cells}/uL (ref 1500–7800)
Neutrophils Relative %: 54.2 %
Platelets: 478 10*3/uL — ABNORMAL HIGH (ref 140–400)
RBC: 4.28 10*6/uL (ref 3.80–5.10)
RDW: 12.7 % (ref 11.0–15.0)
Total Lymphocyte: 37.7 %
WBC mixed population: 360 cells/uL (ref 200–950)
WBC: 5.8 10*3/uL (ref 3.8–10.8)

## 2017-10-16 LAB — HCG, SERUM, QUALITATIVE: Preg, Serum: NEGATIVE

## 2017-10-21 ENCOUNTER — Encounter: Payer: Self-pay | Admitting: Gynecology

## 2017-10-22 NOTE — Telephone Encounter (Signed)
Tell patient her blood count was 11.3 which is right around what she has run in the past.

## 2017-12-07 ENCOUNTER — Other Ambulatory Visit: Payer: Self-pay

## 2017-12-07 ENCOUNTER — Encounter (HOSPITAL_COMMUNITY): Payer: Self-pay | Admitting: *Deleted

## 2017-12-07 ENCOUNTER — Ambulatory Visit (HOSPITAL_COMMUNITY)
Admission: EM | Admit: 2017-12-07 | Discharge: 2017-12-07 | Disposition: A | Discharge status: Home / Self Care | Payer: Commercial Managed Care - PPO | Attending: Internal Medicine | Admitting: Internal Medicine

## 2017-12-07 DIAGNOSIS — M436 Torticollis: Secondary | ICD-10-CM

## 2017-12-07 DIAGNOSIS — M542 Cervicalgia: Secondary | ICD-10-CM

## 2017-12-07 MED ORDER — KETOROLAC TROMETHAMINE 60 MG/2ML IM SOLN
INTRAMUSCULAR | Status: AC
  Filled 2017-12-07: qty 2

## 2017-12-07 MED ORDER — KETOROLAC TROMETHAMINE 60 MG/2ML IM SOLN
60.0000 mg | Freq: Once | INTRAMUSCULAR | Status: AC
  Administered 2017-12-07: 60 mg via INTRAMUSCULAR

## 2017-12-07 MED ORDER — CYCLOBENZAPRINE HCL 10 MG PO TABS: 10.0000 mg | ORAL_TABLET | Freq: Two times a day (BID) | ORAL | 0 refills | Status: DC | PRN

## 2017-12-07 MED ORDER — TRAMADOL HCL 50 MG PO TABS: 50.0000 mg | ORAL_TABLET | Freq: Four times a day (QID) | ORAL | 0 refills | Status: AC | PRN

## 2017-12-07 NOTE — Discharge Instructions (Addendum)
Use anti-inflammatories for pain/swelling. You may take up to 800 mg Ibuprofen every 8 hours with food. You may supplement Ibuprofen with Tylenol [phone] mg every 8 hours.   For more severe pain you may take tramadol every 6 hours.  This may cause sedation, please do not drive after use.  Please limit to use it at home and at bedtime.  You may also supplement both the anti-inflammatories and tramadol with Flexeril which is a muscle relaxer.  Please take every 12 hours as needed.  Continue ice and heat.  Return if symptoms worsening, or return in 1 week if not having any improvement.

## 2017-12-07 NOTE — ED Triage Notes (Signed)
Per pt right side of her neck hurts, per pt she is having muscle spasms, started Thursday, took Ibuprofen 800 mg around 11 am.

## 2017-12-07 NOTE — ED Provider Notes (Signed)
[place]    CSN: [id number] Arrival date & time: 12/07/17  1642     History   Chief Complaint Chief Complaint  Patient presents with  . Neck Pain  . Headache    HPI Coraline L Asch is a 37 y.o. female no significant past medical history presenting today with right-sided neck pain.  States that since Thursday she has had tenderness in her neck as well as spasming.  It is located at the right side of the base of her neck.  Difficult to turn head.  Does note some occasional dizziness, describes this more as spinning.  Has had a history of vertigo and states that this is different.  It is intermittent.  Denies lightheadedness or syncope.  Denies any vision changes.  Denies any nausea or vomiting.  Denies any shortness of breath or chest pain.  Denies any injury or accident.  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  Patient states that she has been taking 4-5 over-the-counter ibuprofens every 4 hours and also using heating pad.  HPI  Past Medical History:  Diagnosis Date  . Obesity   . Pneumonia 2007    Patient Active Problem List   Diagnosis Date Noted  . Family history of breast cancer   . Need for Tdap vaccination 08/06/2011  . Obesity 08/06/2011    Past Surgical History:  Procedure Laterality Date  . CESAREAN SECTION      OB History    Gravida  3   Para  2   Term  2   Preterm      AB  1   Living  2     SAB      TAB  1   Ectopic      Multiple      Live Births               Home Medications    Prior to Admission medications   Medication Sig Start Date End Date Taking? Authorizing Provider  ibuprofen (ADVIL,MOTRIN) 200 MG tablet Take 200 mg by mouth every 6 (six) hours as needed.   Yes [provider]  cyclobenzaprine (FLEXERIL) 10 MG tablet Take 1 tablet (10 mg total) by mouth 2 (two) times daily as needed for muscle spasms. 12/07/17   Lanetra Hartley C, PA-C    traMADol (ULTRAM) 50 MG tablet Take 1 tablet (50 mg total) by mouth every 6 (six) hours as needed for up to 3 days. 12/07/17 12/10/17  Brynlea Spindler, Elesa Hacker, PA-C    Family History Family History  Problem Relation Age of Onset  . Diabetes Mother   . Hypertension Mother   . Breast cancer Mother 10  . Breast cancer Maternal Grandmother 36  . Heart disease Neg Hx   . Stroke Neg Hx     Social History Social History   Tobacco Use  . Smoking status: Never Smoker  . Smokeless tobacco: Never Used  Substance Use Topics  . Alcohol use: No    Alcohol/week: 0.0 oz  . Drug use: No     Allergies   Patient has no known allergies.   Review of Systems Review of Systems  Constitutional: Negative for fatigue and fever.  Eyes: Negative for visual disturbance.  Respiratory: Negative for shortness of breath.   Cardiovascular: Negative for chest pain.  Gastrointestinal: Negative for nausea and vomiting.  Musculoskeletal: Positive for myalgias, neck pain and neck stiffness.  Negative for arthralgias and back pain.  Skin: Negative for color change, rash and wound.  Neurological: Positive for dizziness. Negative for syncope, weakness, light-headedness, numbness and headaches.     Physical Exam Triage Vital Signs ED Triage Vitals  Enc Vitals Group     BP 12/07/17 1705 (!) 148/89     Pulse Rate 12/07/17 1705 99     Resp 12/07/17 1705 18     Temp 12/07/17 1705 97.8 F (36.6 C)     Temp Source 12/07/17 1705 Oral     SpO2 12/07/17 1705 100 %     Weight --      Height --      Head Circumference --      Peak Flow --      Pain Score 12/07/17 1712 10     Pain Loc --      Pain Edu? --      Excl. in [place]? --    No data found.  Updated Vital Signs BP (!) 148/89 (BP Location: Left Arm) Comment: Notified Tina  Pulse 99   Temp 97.8 F (36.6 C) (Oral)   Resp 18   LMP 12/04/2017 (Approximate)   SpO2 100%   Visual Acuity Right Eye Distance:   Left Eye Distance:   Bilateral Distance:     Right Eye Near:   Left Eye Near:    Bilateral Near:     Physical Exam  Constitutional: She is oriented to person, place, and time. She appears well-developed and well-nourished. No distress.  HENT:  Head: Normocephalic and atraumatic.  Eyes: Pupils are equal, round, and reactive to light. Conjunctivae and EOM are normal.  Neck: Neck supple.  No bruits auscultated  Cardiovascular: Normal rate and regular rhythm.  No murmur heard. Pulmonary/Chest: Effort normal and breath sounds normal. No respiratory distress.  Breathing comfortably at rest, CTA BL  Abdominal: Soft. There is no tenderness.  Genitourinary:  Genitourinary Comments: Tenderness to palpation of musculature at base of neck on right side extending into superior back.  Patient has limited range of motion of neck due to pain and stiffness.  Full range of motion of shoulder.  Musculoskeletal: She exhibits no edema.  Neurological: She is alert and oriented to person, place, and time. No cranial nerve deficit.  Skin: Skin is warm and dry.  Psychiatric: She has a normal mood and affect.  Nursing note and vitals reviewed.    UC Treatments / Results  Labs (all labs ordered are listed, but only abnormal results are displayed) Labs Reviewed - No data to display  EKG None Radiology No results found.  Procedures Procedures (including critical care time)  Medications Ordered in UC Medications  ketorolac (TORADOL) injection 60 mg (60 mg Intramuscular Given 12/07/17 1742)     Initial Impression / Assessment and Plan / UC Course  I have reviewed the triage vital signs and the nursing notes.  Pertinent labs & imaging results that were available during my care of the patient were reviewed by me and considered in my medical decision making (see chart for details).     Patient appears to have neck strain/torticollis.  No focal neuro deficits.  Will treat with anti-inflammatories, Flexeril.  Tramadol for severe pain.   Discussed with patient taking ibuprofen every 8 hours instead of every 4 hours.  Also to take it with food.  Follow-up in 1 week if not having any improvement. Discussed strict return precautions. Patient verbalized understanding and is agreeable with plan.   Final  Clinical Impressions(s) / UC Diagnoses   Final diagnoses:  Neck pain  Torticollis    ED Discharge Orders        Ordered    cyclobenzaprine (FLEXERIL) 10 MG tablet  2 times daily PRN     12/07/17 1736    traMADol (ULTRAM) 50 MG tablet  Every 6 hours PRN     12/07/17 1736       Controlled Substance Prescriptions [place] Controlled Substance Registry consulted? Yes, I have consulted the [place] Controlled Substances Registry for this patient, and feel the risk/benefit ratio today is favorable for proceeding with this prescription for a controlled substance.   Janith Lima, [place] 12/07/17 1754

## 2018-01-12 ENCOUNTER — Other Ambulatory Visit: Payer: Self-pay

## 2018-01-12 ENCOUNTER — Encounter: Payer: Self-pay | Admitting: Obstetrics and Gynecology

## 2018-01-12 ENCOUNTER — Ambulatory Visit (INDEPENDENT_AMBULATORY_CARE_PROVIDER_SITE_OTHER): Payer: Commercial Managed Care - PPO | Admitting: Obstetrics and Gynecology

## 2018-01-12 VITALS — BP 138/60 | HR 68 | Resp 18 | Ht 62.0 in | Wt 286.0 lb

## 2018-01-12 DIAGNOSIS — Z124 Encounter for screening for malignant neoplasm of cervix: Secondary | ICD-10-CM

## 2018-01-12 DIAGNOSIS — N921 Excessive and frequent menstruation with irregular cycle: Secondary | ICD-10-CM | POA: Diagnosis not present

## 2018-01-12 DIAGNOSIS — Z0000 Encounter for general adult medical examination without abnormal findings: Secondary | ICD-10-CM | POA: Diagnosis not present

## 2018-01-12 DIAGNOSIS — Z01419 Encounter for gynecological examination (general) (routine) without abnormal findings: Secondary | ICD-10-CM | POA: Diagnosis not present

## 2018-01-12 DIAGNOSIS — Z803 Family history of malignant neoplasm of breast: Secondary | ICD-10-CM | POA: Diagnosis not present

## 2018-01-12 DIAGNOSIS — D259 Leiomyoma of uterus, unspecified: Secondary | ICD-10-CM | POA: Diagnosis not present

## 2018-01-12 DIAGNOSIS — Z6841 Body Mass Index (BMI) 40.0 and over, adult: Secondary | ICD-10-CM | POA: Diagnosis not present

## 2018-01-12 LAB — POCT URINE PREGNANCY: Preg Test, Ur: NEGATIVE

## 2018-01-12 NOTE — Patient Instructions (Addendum)
I would recommend a mediterranean diet.  A mediterranean diet is high in fruits, vegetables, whole grains, fish, chicken, nuts, healthy fats (olive oil or canola oil). Low fat dairy. Limit butter, margarine, red meat and sweets.    EXERCISE AND DIET:  We recommended that you start or continue a regular exercise program for good health. Regular exercise means any activity that makes your heart beat faster and makes you sweat.  We recommend exercising at least 30 minutes per day at least 3 days a week, preferably 4 or 5.  We also recommend a diet low in fat and sugar.  Inactivity, poor dietary choices and obesity can cause diabetes, heart attack, stroke, and kidney damage, among others.    ALCOHOL AND SMOKING:  Women should limit their alcohol intake to no more than 7 drinks/beers/glasses of wine (combined, not each!) per week. Moderation of alcohol intake to this level decreases your risk of breast cancer and liver damage. And of course, no recreational drugs are part of a healthy lifestyle.  And absolutely no smoking or even second hand smoke. Most people know smoking can cause heart and lung diseases, but did you know it also contributes to weakening of your bones? Aging of your skin?  Yellowing of your teeth and nails?  CALCIUM AND VITAMIN D:  Adequate intake of calcium and Vitamin D are recommended.  The recommendations for exact amounts of these supplements seem to change often, but generally speaking 600 mg of calcium (either carbonate or citrate) and 800 units of Vitamin D per day seems prudent. Certain women may benefit from higher intake of Vitamin D.  If you are among these women, your doctor will have told you during your visit.    PAP SMEARS:  Pap smears, to check for cervical cancer or precancers,  have traditionally been done yearly, although recent scientific advances have shown that most women can have pap smears less often.  However, every woman still should have a physical exam from her  gynecologist every year. It will include a breast check, inspection of the vulva and vagina to check for abnormal growths or skin changes, a visual exam of the cervix, and then an exam to evaluate the size and shape of the uterus and ovaries.  And after 38 years of age, a rectal exam is indicated to check for rectal cancers. We will also provide age appropriate advice regarding health maintenance, like when you should have certain vaccines, screening for sexually transmitted diseases, bone density testing, colonoscopy, mammograms, etc.   Breast Self-Awareness Breast self-awareness means being familiar with how your breasts look and feel. It involves checking your breasts regularly and reporting any changes to your health care provider. Practicing breast self-awareness is important. A change in your breasts can be a sign of a serious medical problem. Being familiar with how your breasts look and feel allows you to find any problems early, when treatment is more likely to be successful. All women should practice breast self-awareness, including women who have had breast implants. How to do a breast self-exam One way to learn what is normal for your breasts and whether your breasts are changing is to do a breast self-exam. To do a breast self-exam: Look for Changes  1. Remove all the clothing above your waist. 2. Stand in front of a mirror in a room with good lighting. 3. Put your hands on your hips. 4. Push your hands firmly downward. 5. Compare your breasts in the mirror. Look for differences  between them (asymmetry), such as: ? Differences in shape. ? Differences in size. ? Puckers, dips, and bumps in one breast and not the other. 6. Look at each breast for changes in your skin, such as: ? Redness. ? Scaly areas. 7. Look for changes in your nipples, such as: ? Discharge. ? Bleeding. ? Dimpling. ? Redness. ? A change in position. Feel for Changes  Carefully feel your breasts for lumps and  changes. It is best to do this while lying on your back on the floor and again while sitting or standing in the shower or tub with soapy water on your skin. Feel each breast in the following way:  Place the arm on the side of the breast you are examining above your head.  Feel your breast with the other hand.  Start in the nipple area and make  inch (2 cm) overlapping circles to feel your breast. Use the pads of your three middle fingers to do this. Apply light pressure, then medium pressure, then firm pressure. The light pressure will allow you to feel the tissue closest to the skin. The medium pressure will allow you to feel the tissue that is a little deeper. The firm pressure will allow you to feel the tissue close to the ribs.  Continue the overlapping circles, moving downward over the breast until you feel your ribs below your breast.  Move one finger-width toward the center of the body. Continue to use the  inch (2 cm) overlapping circles to feel your breast as you move slowly up toward your collarbone.  Continue the up and down exam using all three pressures until you reach your armpit.  Write Down What You Find  Write down what is normal for each breast and any changes that you find. Keep a written record with breast changes or normal findings for each breast. By writing this information down, you do not need to depend only on memory for size, tenderness, or location. Write down where you are in your menstrual cycle, if you are still menstruating. If you are having trouble noticing differences in your breasts, do not get discouraged. With time you will become more familiar with the variations in your breasts and more comfortable with the exam. How often should I examine my breasts? Examine your breasts every month. If you are breastfeeding, the best time to examine your breasts is after a feeding or after using a breast pump. If you menstruate, the best time to examine your breasts is  5-7 days after your period is over. During your period, your breasts are lumpier, and it may be more difficult to notice changes. When should I see my health care provider? See your health care provider if you notice:  A change in shape or size of your breasts or nipples.  A change in the skin of your breast or nipples, such as a reddened or scaly area.  Unusual discharge from your nipples.  A lump or thick area that was not there before.  Pain in your breasts.  Anything that concerns you.  This information is not intended to replace advice given to you by your health care provider. Make sure you discuss any questions you have with your health care provider. Document Released: 09/02/2005 Document Revised: 02/08/2016 Document Reviewed: 07/23/2015 Elsevier Interactive Patient Education  [employer].  MAMMOGRAMS:  All women over 75 years old should have a yearly mammogram. Many facilities now offer a "3D" mammogram, which may cost around $50  extra out of pocket. If possible,  we recommend you accept the option to have the 3D mammogram performed.  It both reduces the number of women who will be called back for extra views which then turn out to be normal, and it is better than the routine mammogram at detecting truly abnormal areas.    COLONOSCOPY:  Colonoscopy to screen for colon cancer is recommended for all women at age 42.  We know, you hate the idea of the prep.  We agree, BUT, having colon cancer and not knowing it is worse!!  Colon cancer so often starts as a polyp that can be seen and removed at colonscopy, which can quite literally save your life!  And if your first colonoscopy is normal and you have no family history of colon cancer, most women don't have to have it again for 10 years.  Once every ten years, you can do something that may end up saving your life, right?  We will be happy to help you get it scheduled when you are ready.  Be sure to check your insurance coverage so  you understand how much it will cost.  It may be covered as a preventative service at no cost, but you should check your particular policy.

## 2018-01-12 NOTE — Progress Notes (Addendum)
37 y.o. [id number] MarriedAfrican AmericanF here for annual exam.   Period Duration (Days): bleeding up 26 days  Period Pattern: (!) Irregular Menstrual Flow: Heavy Menstrual Control: Maxi pad Menstrual Control Change Freq (Hours): changes pad every 1-2 hours  Dysmenorrhea: (!) Severe Dysmenorrhea Symptoms: Cramping  She bleed 4/1-4/12, then started 4/22 and still going. Currently flow is light. Last week she saturated through her clothes twice. She is passing golf ball sized blood clots or bigger.  She had a mirena IUD, initially worked well, about a year before it was due to come out she started having 20+ days in a row. GYN removed the IUD, placed another one. Bleeding continued to have abnormal bleeding. Second IUD removed in January of 2019 She was mildly anemic in January, hgb 11.3. She has been bleeding the majority of the time since then. She just started iron tablets last week.   She has 2 children, not sure she is done having kids. Using w/d for contraception. Would be okay with her if she did get pregnant.   She c/o menstrual like cramps during and for a short time after intercourse. Started in 1/19  Patient's last menstrual period was 01/05/2018.          Sexually active: Yes.    The current method of family planning is none.    Exercising: No.  The patient does not participate in regular exercise at present. Smoker:  no  Health Maintenance: Pap:  12-27-14 WNL NEG HR HPV  History of abnormal Pap:  no MMG:  01-24-16 WNL Colonoscopy:  Never BMD:   Never TDaP:  08-06-11 Gardasil: No    reports that she has never smoked. She has never used smokeless tobacco. She reports that she does not drink alcohol or use drugs.  Kids are 10 and 11 (girls). She works for [employer].   Past Medical History:  Diagnosis Date  . Abnormal uterine bleeding   . Dysmenorrhea   . Fibroid   . Obesity   . Pneumonia 2007    Past Surgical History:  Procedure Laterality Date  . CESAREAN  SECTION    H/O C/S, then 1 vaginal delivery.   No current outpatient medications on file.   No current facility-administered medications for this visit.     Family History  Problem Relation Age of Onset  . Diabetes Mother   . Hypertension Mother   . Breast cancer Mother 31  . Breast cancer Maternal Grandmother 21  . Heart disease Neg Hx   . Stroke Neg Hx   Mom with breast cancer in her 39's, MGM died in her 60's with breast cancer. Mother has 2 living sisters, neither with breast cancer.   Review of Systems  Constitutional: Negative.   HENT: Negative.   Eyes: Negative.   Respiratory: Negative.   Cardiovascular: Negative.   Gastrointestinal: Negative.   Endocrine: Negative.   Genitourinary: Positive for dyspareunia and menstrual problem.       Excessive menstrual bleeding   Musculoskeletal: Negative.   Skin: Negative.   Allergic/Immunologic: Negative.   Neurological: Negative.   Psychiatric/Behavioral: Negative.     Exam:   BP 138/60 (BP Location: Right Arm, Patient Position: Sitting, Cuff Size: Normal)   Pulse 68   Resp 18   Ht 5\' 2"  (1.575 m)   Wt 286 lb (129.7 kg)   LMP 01/05/2018   BMI 52.31 kg/m   Weight change: @WEIGHTCHANGE @ Height:   Height: 5\' 2"  (157.5 cm)  Ht Readings from Last 3  Encounters:  01/12/18 5\' 2"  (1.575 m)  01/17/17 5\' 3"  (1.6 m)  01/05/16 5\' 3"  (1.6 m)    General appearance: alert, cooperative and appears stated age Head: Normocephalic, without obvious abnormality, atraumatic Neck: no adenopathy, supple, symmetrical, trachea midline and thyroid normal to inspection and palpation Lungs: clear to auscultation bilaterally Cardiovascular: regular rate and rhythm Breasts: normal appearance, no masses or tenderness Abdomen: soft, non-tender; non distended,  no masses,  no organomegaly Extremities: extremities normal, atraumatic, no cyanosis or edema Skin: Skin color, texture, turgor normal. No rashes or lesions Lymph nodes: Cervical,  supraclavicular, and axillary nodes normal. No abnormal inguinal nodes palpated Neurologic: Grossly normal   Pelvic: External genitalia:  no lesions              Urethra:  normal appearing urethra with no masses, tenderness or lesions              Bartholins and Skenes: normal                 Vagina: normal appearing vagina with normal color and discharge, no lesions              Cervix: no lesions               Bimanual Exam:  Uterus:  irregular, not tender, difficult to evaluate size secondary to BMI              Adnexa: no mass, fullness, tenderness               Rectovaginal: Confirms               Anus:  normal sphincter tone, no lesions  Chaperone was present for exam.  A:  Well Woman with normal exam  BMI 52  Fibroid uterus  Menometrorrhagia for the last 6 months, Mirena IUD controlled her bleeding for 4 years  Family history of breast cancer  P:   Pap with hpv  Will review genetics recommendations  Discussed breast self exam  Discussed calcium and vit D intake  CBC, ferritin, screening  HgbA1C, TSH  Return for sonohysterogram, possible endometrial biopsy  Addendum: Genetics report reviewed [place] 22.3% risk of breast cancer, recommendation was for annual screening with mammogram and breast MRI

## 2018-01-13 LAB — HEMOGLOBIN A1C
Est. average glucose Bld gHb Est-mCnc: 97 mg/dL
Hgb A1c MFr Bld: 5 % (ref 4.8–5.6)

## 2018-01-13 LAB — CBC
HEMATOCRIT: 33.9 % — AB (ref 34.0–46.6)
Hemoglobin: 10.9 g/dL — ABNORMAL LOW (ref 11.1–15.9)
MCH: 26.3 pg — AB (ref 26.6–33.0)
MCHC: 32.2 g/dL (ref 31.5–35.7)
MCV: 82 fL (ref 79–97)
Platelets: 455 10*3/uL — ABNORMAL HIGH (ref 150–379)
RBC: 4.14 x10E6/uL (ref 3.77–5.28)
RDW: 14.7 % (ref 12.3–15.4)
WBC: 5.2 10*3/uL (ref 3.4–10.8)

## 2018-01-13 LAB — COMPREHENSIVE METABOLIC PANEL
A/G RATIO: 1 — AB (ref 1.2–2.2)
ALBUMIN: 3.7 g/dL (ref 3.5–5.5)
ALK PHOS: 60 IU/L (ref 39–117)
ALT: 20 IU/L (ref 0–32)
AST: 20 IU/L (ref 0–40)
BUN / CREAT RATIO: 8 — AB (ref 9–23)
BUN: 5 mg/dL — ABNORMAL LOW (ref 6–20)
Bilirubin Total: 0.2 mg/dL (ref 0.0–1.2)
CO2: 26 mmol/L (ref 20–29)
CREATININE: 0.6 mg/dL (ref 0.57–1.00)
Calcium: 9 mg/dL (ref 8.7–10.2)
Chloride: 102 mmol/L (ref 96–106)
GFR calc Af Amer: 135 mL/min/{1.73_m2} (ref >59)
GFR calc non Af Amer: 117 mL/min/{1.73_m2} (ref >59)
GLOBULIN, TOTAL: 3.6 g/dL (ref 1.5–4.5)
Glucose: 77 mg/dL (ref 65–99)
POTASSIUM: 4.3 mmol/L (ref 3.5–5.2)
SODIUM: 140 mmol/L (ref 134–144)
Total Protein: 7.3 g/dL (ref 6.0–8.5)

## 2018-01-13 LAB — LIPID PANEL
CHOL/HDL RATIO: 3.7 ratio (ref 0.0–4.4)
Cholesterol, Total: 191 mg/dL (ref 100–199)
HDL: 51 mg/dL (ref >39)
LDL Calculated: 126 mg/dL — ABNORMAL HIGH (ref 0–99)
Triglycerides: 70 mg/dL (ref 0–149)
VLDL CHOLESTEROL CAL: 14 mg/dL (ref 5–40)

## 2018-01-13 LAB — FERRITIN: FERRITIN: 52 ng/mL (ref 15–150)

## 2018-01-13 LAB — TSH: TSH: 1.75 u[IU]/mL (ref 0.450–4.500)

## 2018-01-15 ENCOUNTER — Other Ambulatory Visit (HOSPITAL_COMMUNITY)
Admission: RE | Admit: 2018-01-15 | Discharge: 2018-01-15 | Disposition: A | Discharge status: Home / Self Care | Payer: Commercial Managed Care - PPO | Source: Ambulatory Visit | Attending: Obstetrics and Gynecology | Admitting: Obstetrics and Gynecology

## 2018-01-15 ENCOUNTER — Telehealth: Payer: Self-pay | Admitting: Obstetrics and Gynecology

## 2018-01-15 DIAGNOSIS — Z124 Encounter for screening for malignant neoplasm of cervix: Secondary | ICD-10-CM | POA: Insufficient documentation

## 2018-01-15 DIAGNOSIS — N939 Abnormal uterine and vaginal bleeding, unspecified: Secondary | ICD-10-CM

## 2018-01-15 NOTE — Addendum Note (Signed)
Addended by: Dorothy Spark on: 01/15/2018 11:12 AM   Modules accepted: Orders

## 2018-01-15 NOTE — Telephone Encounter (Signed)
Patient returned call. Reviewed benefits for a sonohysterogram and endometrial biopsy. Patient understood information presented. Patient states her "daughter has surgery coming up and I just need to focus on this", adding she will need to wait to have these procedures. Advised patient I will forward to Dr Talbert Nan to review.  Routing to Dr Talbert Nan  cc: Lamont Snowball, RN  cc: Thayer Ohm

## 2018-01-15 NOTE — Telephone Encounter (Signed)
Call placed to patient to review benefits for a sonohysterogram and endometrial biopsy. Left voicemail message requesting a return call.   cc: Lamont Snowball, RN  cc: Thayer Ohm

## 2018-01-16 ENCOUNTER — Telehealth: Payer: Self-pay

## 2018-01-16 NOTE — Telephone Encounter (Signed)
Gay Filler this patient has had very abnormal bleeding, has an elevated BMI and is at risk of endometrial hyperplasia. I don't recommend that she put of evaluation for more than a month. Please discuss this with her.

## 2018-01-16 NOTE — Telephone Encounter (Signed)
Left message to call [name] at [phone]. 

## 2018-01-16 NOTE — Telephone Encounter (Signed)
-----   Message from Salvadore Dom, MD sent at 01/15/2018 12:06 PM EDT ----- Please let the patient know that I reviewed the recommendations from the geneticist. The patient as a 22.3% risk of breast cancer ([place] Cus[place]) and should be getting yearly mammograms (3D) and yearly breast MRI's. If she is agreeable, please set up the MRI. Her mammogram is due this month. Her anemia is slightly worse, but still mild, she should continue the iron pills (just started). Her LDL cholesterol was mildly elevated. The rest of her blood work was normal.  I would recommend a mediterranean diet and regular exercise.  A mediterranean diet is high in fruits, vegetables, whole grains, fish, chicken, nuts, healthy fats (olive oil or canola oil). Low fat dairy. Limit butter, margarine, red meat and sweets.   Her pap is pending

## 2018-01-19 LAB — CYTOLOGY - PAP: HPV: NOT DETECTED

## 2018-01-19 NOTE — Telephone Encounter (Signed)
Spoke with patient. Results and message given as seen below from [place]. Patient does not wish to proceed with yearly breast MRI at this time. Will work on diet and exercise for elevated LDL. Encounter closed.

## 2018-01-21 ENCOUNTER — Encounter: Payer: Commercial Managed Care - PPO | Admitting: Gynecology

## 2018-01-23 ENCOUNTER — Other Ambulatory Visit: Payer: Self-pay | Admitting: *Deleted

## 2018-01-23 DIAGNOSIS — R87611 Atypical squamous cells cannot exclude high grade squamous intraepithelial lesion on cytologic smear of cervix (ASC-H): Secondary | ICD-10-CM

## 2018-01-23 DIAGNOSIS — R87619 Unspecified abnormal cytological findings in specimens from cervix uteri: Secondary | ICD-10-CM

## 2018-01-26 NOTE — Telephone Encounter (Signed)
Patient is aware this is preferred but this is the only option she would agree to. She is aware based on results, SHGM may well be recommended.

## 2018-01-26 NOTE — Telephone Encounter (Signed)
One of the reasons I wanted to have the option to do a sonohysterogram was to see if she had any myomas impinging on her cavity. If she just has the ultrasound, we may miss this (also possible we won't need to do the sonohysterogram)

## 2018-01-26 NOTE — Telephone Encounter (Signed)
Call to patient. Discussed importance of proceeding with evaluation for endometrial hyperplasia. Advised that with her risk factors, Dr Talbert Nan does not recommend delay evaluation > one month.  Patient states daughters surgery is not until August but she has financial limitations.  Alternatives reviewed and patient prefers to proceed with ultrasound at hospital and come to office for consult and endometrial biopsy.  Advised will schedule and call patient back.

## 2018-01-27 ENCOUNTER — Telehealth: Payer: Self-pay | Admitting: *Deleted

## 2018-01-27 NOTE — Telephone Encounter (Signed)
Pelvic ultrasound scheduled at [facility] as requested by patient. See previous phone note. Per Dr Gentry Fitz recommendation, advised patient of potential for [facility] and endometrial biopsy.  Discussed benefits of office versus hospital.Call transferred to Luke in business office.

## 2018-01-27 NOTE — Telephone Encounter (Signed)
Received call from Lamont Snowball, RN. Patient is agreeable to scheduling the recommended sonohysterogram and endometrial biopsy with Dr Talbert Nan. Patient states the onset of her last period was on 01/05/18, adding her cycles are irregular. Reviewed scheduling options with Lamont Snowball, RN. Advised patient she will need to call at the onset of her next cycle to schedule the sonohysterogram and endometrial biopsy. Patient acknowledges understanding and is agreeable.   Patient will also need a colposcopy. Reviewed benefits for this procedure. Patient understood and is agreeable, see account notes for details. Discussed scheduling options with Lamont Snowball, RN for the colposcopy. Patient is scheduled 01/28/18 with Dr Talbert Nan. Patient is aware of the appointment date, arrival time and cancellation policy. Patient has no further questions.  Routing to Lamont Snowball, RN

## 2018-01-28 ENCOUNTER — Other Ambulatory Visit: Payer: Self-pay

## 2018-01-28 ENCOUNTER — Encounter: Payer: Commercial Managed Care - PPO | Admitting: Obstetrics and Gynecology

## 2018-01-28 ENCOUNTER — Encounter

## 2018-01-28 ENCOUNTER — Ambulatory Visit (INDEPENDENT_AMBULATORY_CARE_PROVIDER_SITE_OTHER): Payer: Commercial Managed Care - PPO | Admitting: Obstetrics and Gynecology

## 2018-01-28 ENCOUNTER — Encounter: Payer: Self-pay | Admitting: Obstetrics and Gynecology

## 2018-01-28 VITALS — BP 140/80 | HR 88 | Resp 16 | Wt 280.0 lb

## 2018-01-28 DIAGNOSIS — R87619 Unspecified abnormal cytological findings in specimens from cervix uteri: Secondary | ICD-10-CM

## 2018-01-28 DIAGNOSIS — Z01812 Encounter for preprocedural laboratory examination: Secondary | ICD-10-CM | POA: Diagnosis not present

## 2018-01-28 DIAGNOSIS — R87611 Atypical squamous cells cannot exclude high grade squamous intraepithelial lesion on cytologic smear of cervix (ASC-H): Secondary | ICD-10-CM | POA: Diagnosis not present

## 2018-01-28 LAB — POCT URINE PREGNANCY: PREG TEST UR: NEGATIVE

## 2018-01-28 NOTE — Patient Instructions (Signed)

## 2018-01-28 NOTE — Telephone Encounter (Signed)
See office notes from appointment on 01-28-18.   Routing to provider. Encounter closed.

## 2018-01-28 NOTE — Progress Notes (Signed)
GYNECOLOGY  VISIT   HPI: 37 y.o.   Married  [place] American  female   [phone] with Patient's last menstrual period was 01/05/2018.   here for evaluation of an abnormal pap. Pap returned as ASC-H, few atypical glandular cells, negative HPV. She has very irregular cycles, LMP on 01/05/18, bleed until 5/10. She is having unprotected sex.   GYNECOLOGIC HISTORY: Patient's last menstrual period was 01/05/2018. Contraception:none  Menopausal hormone therapy: none         OB History    Gravida  3   Para  2   Term  2   Preterm      AB  1   Living  2     SAB      TAB  1   Ectopic      Multiple      Live Births                 Patient Active Problem List   Diagnosis Date Noted  . Family history of breast cancer   . Need for Tdap vaccination 08/06/2011  . Obesity 08/06/2011    Past Medical History:  Diagnosis Date  . Abnormal uterine bleeding   . Dysmenorrhea   . Fibroid   . Obesity   . Pneumonia 2007    Past Surgical History:  Procedure Laterality Date  . CESAREAN SECTION      No current outpatient medications on file.   No current facility-administered medications for this visit.      ALLERGIES: Patient has no known allergies.  Family History  Problem Relation Age of Onset  . Diabetes Mother   . Hypertension Mother   . Breast cancer Mother 86  . Breast cancer Maternal Grandmother 41  . Heart disease Neg Hx   . Stroke Neg Hx     Social History   Socioeconomic History  . Marital status: Married    Spouse name: Not on file  . Number of children: 2  . Years of education: Not on file  . Highest education level: Not on file  Occupational History  . Occupation: Air cabin crew: [employer]  . Financial resource strain: Not on file  . Food insecurity:    Worry: Not on file    Inability: Not on file  . Transportation needs:    Medical: Not on file    Non-medical: Not on file  Tobacco Use  . Smoking status: Never  Smoker  . Smokeless tobacco: Never Used  Substance and Sexual Activity  . Alcohol use: No    Alcohol/week: 0.0 oz  . Drug use: No  . Sexual activity: Yes    Partners: Male    Birth control/protection: None  Lifestyle  . Physical activity:    Days per week: Not on file    Minutes per session: Not on file  . Stress: Not on file  Relationships  . Social connections:    Talks on phone: Not on file    Gets together: Not on file    Attends religious service: Not on file    Active member of club or organization: Not on file    Attends meetings of clubs or organizations: Not on file    Relationship status: Not on file  . Intimate partner violence:    Fear of current or ex partner: Not on file    Emotionally abused: Not on file    Physically abused: Not on file  Forced sexual activity: Not on file  Other Topics Concern  . Not on file  Social History Narrative   Married, children ages 26yo and 85yo, walks for exercise    Review of Systems  Constitutional: Negative.   HENT: Negative.   Eyes: Negative.   Respiratory: Negative.   Cardiovascular: Negative.   Gastrointestinal: Negative.   Genitourinary: Negative.   Musculoskeletal: Negative.   Skin: Negative.   Neurological: Negative.   Endo/Heme/Allergies: Negative.   Psychiatric/Behavioral: Negative.     PHYSICAL EXAMINATION:    BP 140/80 (BP Location: Right Arm, Patient Position: Sitting, Cuff Size: Normal)   Pulse 88   Resp 16   Wt 280 lb (127 kg)   LMP 01/05/2018   BMI 51.21 kg/m     General appearance: alert, cooperative and appears stated age  Pelvic: External genitalia:  No lesions              Urethra:  normal appearing urethra with no masses, tenderness or lesions              Bartholins and Skenes: normal                 Vagina: normal appearing vagina with normal color and discharge, no lesions              Cervix:no gross lesions Colposcopy: satisfactory. Mild aceto-white changes at 1 o'clock, biopsy  done, ECC done. Biopsy site treated with silver nitrate. Negative lugols examination of upper vagina.   Chaperone was present for exam.  ASSESSMENT ASC-H pap with some atypical glandular cells. Discussed abnormal pap. Her HPV test was negative, reviewed that it only means she was negative for those specific high risk strains of HPV.  Fibroid uterus, AUB    PLAN Colposcopy with biopsies and ECC today. No endometrial biopsy done today secondary to unprotected intercourse No intercourse for 2 weeks, then come in for sonohysterogram and endometrial biopsy. Needs UPT prior to procedure   An After Visit Summary was printed and given to the patient.

## 2018-02-03 ENCOUNTER — Ambulatory Visit (HOSPITAL_COMMUNITY): Payer: 59

## 2018-02-03 ENCOUNTER — Telehealth: Payer: Self-pay | Admitting: Obstetrics and Gynecology

## 2018-02-03 NOTE — Telephone Encounter (Signed)
Per Epic appointment desk, PUS, SHGM, endo biopsy is scheduled for 02-10-18.   Routing to provider. Will close encounter.

## 2018-02-03 NOTE — Telephone Encounter (Signed)
Patient called to advises she started her cycle on 02/01/18. Patient is scheduled for a sonohysterogram and endometrial biopsy on 02/01/18 with Dr Talbert Nan. Patient is aware of the appointment date arrival time and cancellation policy. Patient had no further questions.   cc: Dr Talbert Nan  cc: Lamont Snowball, RN

## 2018-02-06 ENCOUNTER — Telehealth: Payer: Self-pay

## 2018-02-06 NOTE — Telephone Encounter (Signed)
-----   Message from Salvadore Dom, MD sent at 02/03/2018  7:46 PM EDT ----- Please let the patient know that her cervical biopsy returned with CIN I, her ECC was negative. This doesn't explain her abnormal pap. The options are f/u pap with hpv in 12 and 24 months, or a leep procedure. She also had atypical glandular cells on her pap and is returning for an endometrial biopsy. I think we should wait for that biopsy to make a decision. Please explain this to the patient and hold onto the chart.

## 2018-02-06 NOTE — Telephone Encounter (Signed)
Spoke with patient. Results given. Patient verbalizes understanding. Has EMB scheduled for 02/10/2018. Will keep this as planned and plan to make a decision after the biopsy is completed. Will hold result.

## 2018-02-10 ENCOUNTER — Ambulatory Visit (INDEPENDENT_AMBULATORY_CARE_PROVIDER_SITE_OTHER): Payer: Commercial Managed Care - PPO | Admitting: Obstetrics and Gynecology

## 2018-02-10 ENCOUNTER — Encounter: Payer: Self-pay | Admitting: Obstetrics and Gynecology

## 2018-02-10 ENCOUNTER — Ambulatory Visit (INDEPENDENT_AMBULATORY_CARE_PROVIDER_SITE_OTHER): Payer: Commercial Managed Care - PPO

## 2018-02-10 VITALS — BP 118/76 | HR 84 | Ht 62.0 in | Wt 283.8 lb

## 2018-02-10 DIAGNOSIS — N939 Abnormal uterine and vaginal bleeding, unspecified: Secondary | ICD-10-CM

## 2018-02-10 DIAGNOSIS — D259 Leiomyoma of uterus, unspecified: Secondary | ICD-10-CM

## 2018-02-10 DIAGNOSIS — N921 Excessive and frequent menstruation with irregular cycle: Secondary | ICD-10-CM

## 2018-02-10 NOTE — Progress Notes (Signed)
Patient will return at a later date to discuss ultrasound findings and possible SHGM if needed. Appt. Made 02-17-18 10:00am.

## 2018-02-11 ENCOUNTER — Encounter: Payer: Self-pay | Admitting: Obstetrics and Gynecology

## 2018-02-17 ENCOUNTER — Encounter: Payer: Self-pay | Admitting: Obstetrics and Gynecology

## 2018-02-17 ENCOUNTER — Other Ambulatory Visit: Payer: Self-pay

## 2018-02-17 ENCOUNTER — Ambulatory Visit (INDEPENDENT_AMBULATORY_CARE_PROVIDER_SITE_OTHER): Payer: Commercial Managed Care - PPO | Admitting: Obstetrics and Gynecology

## 2018-02-17 VITALS — BP 132/70 | HR 84 | Resp 18 | Wt 280.0 lb

## 2018-02-17 DIAGNOSIS — Z01812 Encounter for preprocedural laboratory examination: Secondary | ICD-10-CM

## 2018-02-17 DIAGNOSIS — Z6841 Body Mass Index (BMI) 40.0 and over, adult: Secondary | ICD-10-CM | POA: Diagnosis not present

## 2018-02-17 DIAGNOSIS — D259 Leiomyoma of uterus, unspecified: Secondary | ICD-10-CM | POA: Diagnosis not present

## 2018-02-17 DIAGNOSIS — N939 Abnormal uterine and vaginal bleeding, unspecified: Secondary | ICD-10-CM | POA: Diagnosis not present

## 2018-02-17 DIAGNOSIS — Z9189 Other specified personal risk factors, not elsewhere classified: Secondary | ICD-10-CM

## 2018-02-17 DIAGNOSIS — R87619 Unspecified abnormal cytological findings in specimens from cervix uteri: Secondary | ICD-10-CM | POA: Diagnosis not present

## 2018-02-17 NOTE — Patient Instructions (Signed)

## 2018-02-17 NOTE — Progress Notes (Signed)
GYNECOLOGY  VISIT   HPI: 37 y.o.   Married  [place] American  female   [phone] with Patient's last menstrual period was 02/01/2018 (exact date).   here to discuss recent U/S results     The patient has been having AUB with mild anemia (hgb 10.9). Normal TSH. Previously helped with a mirena IUD for 4 years. Second IUD placed early, continued to have abnormal bleeding and that IUD was removed in 1/19. She had a recent pap that returned with ASC-H and a few atypical glandular cells. Colposcopy was satisfactory and returned with CIN I, ECC was negative.   GYNECOLOGIC HISTORY: Patient's last menstrual period was 02/01/2018 (exact date). Contraception:none  Menopausal hormone therapy: none        OB History    Gravida  3   Para  2   Term  2   Preterm      AB  1   Living  2     SAB      TAB  1   Ectopic      Multiple      Live Births                 Patient Active Problem List   Diagnosis Date Noted  . Family history of breast cancer   . Need for Tdap vaccination 08/06/2011  . Obesity 08/06/2011    Past Medical History:  Diagnosis Date  . Abnormal uterine bleeding   . Dysmenorrhea   . Fibroid   . Obesity   . Pneumonia 2007    Past Surgical History:  Procedure Laterality Date  . CESAREAN SECTION      No current outpatient medications on file.   No current facility-administered medications for this visit.      ALLERGIES: Patient has no known allergies.  Family History  Problem Relation Age of Onset  . Diabetes Mother   . Hypertension Mother   . Breast cancer Mother 34  . Breast cancer Maternal Grandmother 83  . Heart disease Neg Hx   . Stroke Neg Hx     Social History   Socioeconomic History  . Marital status: Married    Spouse name: Not on file  . Number of children: 2  . Years of education: Not on file  . Highest education level: Not on file  Occupational History  . Occupation: Air cabin crew: [employer]   . Financial resource strain: Not on file  . Food insecurity:    Worry: Not on file    Inability: Not on file  . Transportation needs:    Medical: Not on file    Non-medical: Not on file  Tobacco Use  . Smoking status: Never Smoker  . Smokeless tobacco: Never Used  Substance and Sexual Activity  . Alcohol use: No    Alcohol/week: 0.0 oz  . Drug use: No  . Sexual activity: Yes    Partners: Male    Birth control/protection: None  Lifestyle  . Physical activity:    Days per week: Not on file    Minutes per session: Not on file  . Stress: Not on file  Relationships  . Social connections:    Talks on phone: Not on file    Gets together: Not on file    Attends religious service: Not on file    Active member of club or organization: Not on file    Attends meetings of clubs or organizations:  Not on file    Relationship status: Not on file  . Intimate partner violence:    Fear of current or ex partner: Not on file    Emotionally abused: Not on file    Physically abused: Not on file    Forced sexual activity: Not on file  Other Topics Concern  . Not on file  Social History Narrative   Married, children ages 59yo and 21yo, walks for exercise    Review of Systems  Constitutional: Negative.   HENT: Negative.   Eyes: Negative.   Respiratory: Negative.   Cardiovascular: Negative.   Gastrointestinal: Negative.   Genitourinary: Negative.   Musculoskeletal: Negative.   Skin: Negative.   Neurological: Negative.   Psychiatric/Behavioral: Negative.     PHYSICAL EXAMINATION:    BP 132/70 (BP Location: Right Wrist, Patient Position: Sitting, Cuff Size: Normal)   Pulse 84   Resp 18   Wt 280 lb (127 kg)   LMP 02/01/2018 (Exact Date)   BMI 51.21 kg/m     General appearance: alert, cooperative and appears stated age Neck: supple, no thyromegaly Heart: regular rate and rhythm Lungs: CTAB Abdomen: soft, non-tender; bowel sounds normal; no masses,  no organomegaly Extremities:  normal, atraumatic, no cyanosis Skin: normal color, texture and turgor, no rashes or lesions Lymph: normal cervical supraclavicular and inguinal nodes Neurologic: grossly normal    Pelvic: External genitalia:  no lesions              Urethra:  normal appearing urethra with no masses, tenderness or lesions              Bartholins and Skenes: normal                 Vagina: normal appearing vagina with normal color and discharge, no lesions              Cervix: no lesions  The risks of endometrial biopsy were reviewed and a consent was obtained.  A speculum was placed in the vagina and the cervix was cleansed with betadine. The mini-pipelle was placed into the endometrial cavity. The uterus sounded to 13 cm. The endometrial biopsy was performed, moderate tissue was obtained. The tenaculum and speculum were removed. There were no complications.   Chaperone was present for exam.  Ultrasound images were reviewed with the patient  ASSESSMENT AUB/menometrorrhagia Fibroid uterus ASC-H pap with atypical glandular cells, colpos in 5/19 was satisfactory with CIN I and negative ECC BMI 51 Elevated risk of breast cancer (22.3%)   PLAN Endometrial biopsy done We discussed the option of sonohysterogram if she wants to avoid hysterectomy, to see if she is a candidate for hysteroscopic resection. I wouldn't recommend OCP's or depo-provera for her.  The patient would like to proceed with hysterectomy. States she is sure she doesn't want any more children and wants definitive surgery Discussed total laparoscopic hysterectomy, bilateral salpingectomy and cystoscopy. Reviewed the risks of the procedure, including infection, bleeding, damage to bowel/badder/vessels/ureters. Discussed post operative recovery and risk of cuff dehiscence. All of her questions were answered Will need surgery at [facility]    An After Visit Summary was printed and given to the patient.

## 2018-02-18 LAB — POCT URINE PREGNANCY: PREG TEST UR: NEGATIVE

## 2018-02-23 ENCOUNTER — Telehealth: Payer: Self-pay | Admitting: *Deleted

## 2018-02-23 NOTE — Telephone Encounter (Signed)
Call to patient to review results. Left message to call back.

## 2018-02-23 NOTE — Telephone Encounter (Signed)
Call from patient. Notified of negative endometrial biopsy result. Disucssed alternative of [place] which patietn declined. Wants definitive treatment of hysterectomy. Transferred to business office.

## 2018-02-23 NOTE — Telephone Encounter (Signed)
-----   Message from Salvadore Dom, MD sent at 02/20/2018  5:12 PM EDT ----- Please let the patient know that her biopsy is negative. We can move forward with hysterectomy if desired. Please also give her the option of a consultation for a uterine artery embolization.

## 2018-02-23 NOTE — Telephone Encounter (Signed)
Spoke with pt regarding benefit for surgery. Patient understood and agreeable. Patient aware this is professional benefit only. Patient aware will be contacted by hospital for separate benefits. Patent advises she will call back this week to confirm. Patient states she would like to look at scheduling after 03/24/18.   cc: Lamont Snowball, RN

## 2018-02-25 NOTE — Telephone Encounter (Signed)
Call to patient to review alternative options to surgery. Per DPR can leave message on voice mail. Left message that can revisit [facility] and possible hysteroscopic resection of fibroid or [place].  Left message to call back.  Can speak with triage nurse if I am not available.

## 2018-02-25 NOTE — Telephone Encounter (Signed)
Patient called to advise she would like to defer scheduling surgery. Patient advises when she has surgery, her "Leave of Absence" will be without pay. Patient advises she is unable to proceed at this time.  Routing to Lamont Snowball, RN

## 2018-02-26 NOTE — Telephone Encounter (Signed)
Patient returning call.

## 2018-02-27 NOTE — Telephone Encounter (Signed)
Call to patient. Options offered. Preference would be to proceed with surgery. Advised will check options and call her back.

## 2018-02-27 NOTE — Telephone Encounter (Signed)
Patient has discussed alternatives with business office. See account notes. Ready to proceed.  Surgery date options discussed. Patient requests 03-30-18.  Surgery will be scheduled for 0730 at [facility] on 03-30-18. Surgery instruction sheet reviewed and printed copy will be provided at consult scheduled for 03-03-18.   Routing to provider for final review. Patient agreeable to disposition. Will close encounter.

## 2018-03-03 ENCOUNTER — Ambulatory Visit: Payer: Commercial Managed Care - PPO | Admitting: Obstetrics and Gynecology

## 2018-03-03 ENCOUNTER — Telehealth: Payer: Self-pay

## 2018-03-03 NOTE — Telephone Encounter (Signed)
Spoke with patient. Pre-op appointment for today cancelled. Pre-op moved to 03/17/2018 at 4 pm with Dr.Jertson. Patient is agreeable to date and time. States that when she comes in for that appointment she will need a letter to take to her employer about why she will need to be out of work.  Cc: Lamont Snowball, RN  Routing to provider for final review. Patient agreeable to disposition. Will close encounter.

## 2018-03-03 NOTE — Telephone Encounter (Signed)
Left message to return call to [place] or [place]iller at [phone].  Need to move patient's pre-op appointment scheduled for today at 4 pm. Patient's surgery was moved to 04/07/18 at [facility] due to her medical history. Needs pre-op 3 weeks before surgery.

## 2018-03-04 ENCOUNTER — Telehealth: Payer: Self-pay | Admitting: Obstetrics and Gynecology

## 2018-03-04 ENCOUNTER — Encounter: Payer: Self-pay | Admitting: *Deleted

## 2018-03-04 NOTE — Telephone Encounter (Signed)
Patient is requesting a letter from [employer] with details about what type of surgery she is having and dates of her surgery and post op dates. Patient is requesting this letter for her employer.

## 2018-03-04 NOTE — Telephone Encounter (Signed)
Letter reviewed and signed by Dr Talbert Nan.  Call to patient. Per ROI can leave message on voice mail.  Left message letter for employer is ready for pick-up. Patient requested ASAP due to employer request.  Routing to provider for final review. Will close encounter.

## 2018-03-04 NOTE — Telephone Encounter (Signed)
Return call to patient.  States needs letter for work ASAP since not eligible for [employer]. Reviewed type of information to be released. To include diagnosis of uterine fibroids and anemia.Patient agreeable for employer to have this.  Surgery instruction sheet reviewed and printed copy will be mail.   Letter for employer to Dr Talbert Nan for review.

## 2018-03-17 ENCOUNTER — Encounter: Payer: Self-pay | Admitting: Obstetrics and Gynecology

## 2018-03-17 ENCOUNTER — Other Ambulatory Visit: Payer: Self-pay

## 2018-03-17 ENCOUNTER — Ambulatory Visit (INDEPENDENT_AMBULATORY_CARE_PROVIDER_SITE_OTHER): Payer: Commercial Managed Care - PPO | Admitting: Obstetrics and Gynecology

## 2018-03-17 VITALS — BP 130/81 | HR 82 | Resp 16 | Wt 281.8 lb

## 2018-03-17 DIAGNOSIS — N946 Dysmenorrhea, unspecified: Secondary | ICD-10-CM

## 2018-03-17 DIAGNOSIS — D259 Leiomyoma of uterus, unspecified: Secondary | ICD-10-CM

## 2018-03-17 DIAGNOSIS — D500 Iron deficiency anemia secondary to blood loss (chronic): Secondary | ICD-10-CM

## 2018-03-17 DIAGNOSIS — N921 Excessive and frequent menstruation with irregular cycle: Secondary | ICD-10-CM

## 2018-03-17 NOTE — Progress Notes (Signed)
GYNECOLOGY  VISIT   HPI: 37 y.o.   Married  [place] American  female   [phone] with Patient's last menstrual period was 03/02/2018 (exact date).   here for a pre-op exam. The patient has a symptomatic fibroid uterus and desires hysterectomy. She has had  menometrorrhagia leading to mild anemia, last hgb was 10.9 gm/dl. Also c/o dysmenorrhea. 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 Endometrial biopsy was benign.    Sexually active, sometimes uses condoms, other times uses withdrawal for contraception.   GYNECOLOGIC HISTORY: Patient's last menstrual period was 03/02/2018 (exact date). Contraception:None Menopausal hormone therapy: NA 01/15/18: ASC-H pap with atypical glandular cells, colpos in 5/19 was satisfactory with CIN I and negative ECC         OB History    Gravida  3   Para  2   Term  2   Preterm      AB  1   Living  2     SAB      TAB  1   Ectopic      Multiple      Live Births                 Patient Active Problem List   Diagnosis Date Noted  . Family history of breast cancer   . Need for Tdap vaccination 08/06/2011  . Obesity 08/06/2011    Past Medical History:  Diagnosis Date  . Abnormal uterine bleeding   . Dysmenorrhea   . Fibroid   . Obesity   . Pneumonia 2007    Past Surgical History:  Procedure Laterality Date  . 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   No current facility-administered medications for this visit.    She is on iron  ALLERGIES: Patient has no known allergies.  Family History  Problem Relation Age of Onset  . Diabetes Mother   . Hypertension Mother   . Breast cancer Mother 29  . Breast cancer Maternal Grandmother 18  . Heart disease Neg Hx   . Stroke Neg Hx     Social History   Socioeconomic History  . Marital status: Married    Spouse name: Not on file  . Number of children: 2  . Years of  education: Not on file  . Highest education level: Not on file  Occupational History  . Occupation: Air cabin crew: [employer]  . Financial resource strain: Not on file  . Food insecurity:    Worry: Not on file    Inability: Not on file  . Transportation needs:    Medical: Not on file    Non-medical: Not on file  Tobacco Use  . Smoking status: Never Smoker  . Smokeless tobacco: Never Used  Substance and Sexual Activity  . Alcohol use: No    Alcohol/week: 0.0 oz  . Drug use: No  . Sexual activity: Yes    Partners: Male    Birth control/protection: None  Lifestyle  . Physical activity:    Days per week: Not on file    Minutes per session: Not on file  . Stress: Not on file  Relationships  . Social connections:    Talks on phone: Not on file    Gets together: Not on file    Attends religious service: Not on file  Active member of club or organization: Not on file    Attends meetings of clubs or organizations: Not on file    Relationship status: Not on file  . Intimate partner violence:    Fear of current or ex partner: Not on file    Emotionally abused: Not on file    Physically abused: Not on file    Forced sexual activity: Not on file  Other Topics Concern  . Not on file  Social History Narrative   Married, children ages 1yo and 26yo, walks for exercise    Review of Systems  Constitutional: Negative.   HENT: Negative.   Eyes: Negative.   Respiratory: Negative.   Cardiovascular: Negative.   Gastrointestinal: Negative.   Genitourinary: Negative.   Musculoskeletal: Negative.   Skin: Negative.   Neurological: Negative.   Endo/Heme/Allergies: Negative.   Psychiatric/Behavioral: Negative.     PHYSICAL EXAMINATION:    LMP 03/02/2018 (Exact Date)     General appearance: alert, cooperative and appears stated age Neck: no adenopathy, supple, symmetrical, trachea midline and thyroid normal to inspection and palpation Heart: regular  rate and rhythm Lungs: CTAB Abdomen: soft, non-tender; bowel sounds normal; no masses,  no organomegaly Extremities: normal, atraumatic, no cyanosis Skin: normal color, texture and turgor, no rashes or lesions Lymph: normal cervical supraclavicular and inguinal nodes Neurologic: grossly normal   ASSESSMENT Symptomatic fibroid uterus, desires definitive treatment. Aware of medical management options and uterine artery embolization. Mild anemia BMI 51    PLAN Discussed total laparoscopic hysterectomy, bilateral salpingectomy and cystoscopy. Reviewed the risks of the procedure, including infection, bleeding, damage to bowel/badder/vessels/ureters.  Discussed the possible need for laparotomy. Discussed post operative recovery and risk of cuff dehiscence. All of her questions were answered. She will use condoms if sexually active prior to surgery Continue with iron supplements     An After Visit Summary was printed and given to the patient.

## 2018-03-25 NOTE — Patient Instructions (Addendum)
Your procedure is scheduled on: Tuesday, July 23  Enter through the Main Entrance of [facility] at: 6 am  Pick up the phone at the desk and dial [phone].  Call this number if you have problems the morning of surgery: [phone].  Remember: Do NOT eat food or Do NOT drink clear liquids (including water) after midnight Monday.  Take these medicines the morning of surgery with a SIP OF WATER: None  Brush your teeth on the morning of surgery.  Stop herbal medications, vitamin supplements, Ibuprofen/NSAIDS 1 week prior to surgery.  Do NOT wear jewelry (body piercing), metal hair clips/bobby pins, make-up, or nail polish. Do NOT wear lotions, powders, or perfumes.  You may wear deoderant. Do NOT shave for 48 hours prior to surgery. Do NOT bring valuables to the hospital.  Leave suitcase in car.  After surgery it may be brought to your room.  For patients admitted to the hospital, checkout time is 11:00 AM the day of discharge. Home with Husband Yvone Neu cell [phone].

## 2018-03-28 NOTE — H&P (Signed)
encounter     GYNECOLOGY  VISIT   HPI: 37 y.o.   Married  [place] American  female   [phone] with Patient's last menstrual period was 03/02/2018 (exact date).   here for a pre-op exam. The patient has a symptomatic fibroid uterus and desires hysterectomy. She has had  menometrorrhagia leading to mild anemia, last hgb was 10.9 gm/dl. Also c/o dysmenorrhea. 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 Endometrial biopsy was benign.    Sexually active, sometimes uses condoms, other times uses withdrawal for contraception.   GYNECOLOGIC HISTORY: Patient's last menstrual period was 03/02/2018 (exact date). Contraception:None Menopausal hormone therapy: NA 01/15/18: ASC-H pap with atypical glandular cells, colpos in 5/19 was satisfactory with CIN I and negative ECC                 OB History    Gravida  3   Para  2   Term  2   Preterm      AB  1   Living  2     SAB      TAB  1   Ectopic      Multiple      Live Births                     Patient Active Problem List   Diagnosis Date Noted  . Family history of breast cancer   . Need for Tdap vaccination 08/06/2011  . Obesity 08/06/2011        Past Medical History:  Diagnosis Date  . Abnormal uterine bleeding   . Dysmenorrhea   . Fibroid   . Obesity   . Pneumonia 2007         Past Surgical History:  Procedure Laterality Date  . 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   No current facility-administered medications for this visit.    She is on iron  ALLERGIES: Patient has no known allergies.       Family History  Problem Relation Age of Onset  . Diabetes Mother   . Hypertension Mother   . Breast cancer Mother 6  . Breast cancer Maternal Grandmother 73  . Heart disease Neg Hx   . Stroke Neg Hx     Social History        Socioeconomic History  . Marital  status: Married    Spouse name: Not on file  . Number of children: 2  . Years of education: Not on file  . Highest education level: Not on file  Occupational History  . Occupation: Air cabin crew: [employer]  . Financial resource strain: Not on file  . Food insecurity:    Worry: Not on file    Inability: Not on file  . Transportation needs:    Medical: Not on file    Non-medical: Not on file  Tobacco Use  . Smoking status: Never Smoker  . Smokeless tobacco: Never Used  Substance and Sexual Activity  . Alcohol use: No    Alcohol/week: 0.0 oz  . Drug use: No  . Sexual activity: Yes    Partners: Male    Birth control/protection: None  Lifestyle  . Physical activity:    Days per week: Not on file    Minutes per session: Not on file  . Stress: Not  on file  Relationships  . Social connections:    Talks on phone: Not on file    Gets together: Not on file    Attends religious service: Not on file    Active member of club or organization: Not on file    Attends meetings of clubs or organizations: Not on file    Relationship status: Not on file  . Intimate partner violence:    Fear of current or ex partner: Not on file    Emotionally abused: Not on file    Physically abused: Not on file    Forced sexual activity: Not on file  Other Topics Concern  . Not on file  Social History Narrative   Married, children ages 75yo and 5yo, walks for exercise    Review of Systems  Constitutional: Negative.   HENT: Negative.   Eyes: Negative.   Respiratory: Negative.   Cardiovascular: Negative.   Gastrointestinal: Negative.   Genitourinary: Negative.   Musculoskeletal: Negative.   Skin: Negative.   Neurological: Negative.   Endo/Heme/Allergies: Negative.   Psychiatric/Behavioral: Negative.     PHYSICAL EXAMINATION:    LMP 03/02/2018 (Exact Date)     General appearance: alert, cooperative and appears  stated age Neck: no adenopathy, supple, symmetrical, trachea midline and thyroid normal to inspection and palpation Heart: regular rate and rhythm Lungs: CTAB Abdomen: soft, non-tender; bowel sounds normal; no masses,  no organomegaly Extremities: normal, atraumatic, no cyanosis Skin: normal color, texture and turgor, no rashes or lesions Lymph: normal cervical supraclavicular and inguinal nodes Neurologic: grossly normal   ASSESSMENT Symptomatic fibroid uterus, desires definitive treatment. Aware of medical management options and uterine artery embolization. Mild anemia BMI 51    PLAN Discussed total laparoscopic hysterectomy, bilateral salpingectomy and cystoscopy. Reviewed the risks of the procedure, including infection, bleeding, damage to bowel/badder/vessels/ureters.  Discussed the possible need for laparotomy. Discussed post operative recovery and risk of cuff dehiscence. All of her questions were answered. She will use condoms if sexually active prior to surgery Continue with iron supplements

## 2018-04-03 ENCOUNTER — Other Ambulatory Visit: Payer: Self-pay

## 2018-04-03 ENCOUNTER — Encounter (HOSPITAL_COMMUNITY)
Admission: RE | Admit: 2018-04-03 | Discharge: 2018-04-03 | Disposition: A | Discharge status: Home / Self Care | Payer: Commercial Managed Care - PPO | Source: Ambulatory Visit | Attending: Obstetrics and Gynecology | Admitting: Obstetrics and Gynecology

## 2018-04-03 ENCOUNTER — Encounter (HOSPITAL_COMMUNITY): Payer: Self-pay

## 2018-04-03 DIAGNOSIS — Z01812 Encounter for preprocedural laboratory examination: Secondary | ICD-10-CM | POA: Diagnosis present

## 2018-04-03 HISTORY — DX: Anemia, unspecified: D64.9

## 2018-04-03 LAB — COMPREHENSIVE METABOLIC PANEL
ALT: 14 U/L (ref 0–44)
ANION GAP: 10 (ref 5–15)
AST: 15 U/L (ref 15–41)
Albumin: 3.5 g/dL (ref 3.5–5.0)
Alkaline Phosphatase: 50 U/L (ref 38–126)
BILIRUBIN TOTAL: 0.4 mg/dL (ref 0.3–1.2)
BUN: 9 mg/dL (ref 6–20)
CHLORIDE: 102 mmol/L (ref 98–111)
CO2: 24 mmol/L (ref 22–32)
Calcium: 8.9 mg/dL (ref 8.9–10.3)
Creatinine, Ser: 0.57 mg/dL (ref 0.44–1.00)
Glucose, Bld: 83 mg/dL (ref 70–99)
POTASSIUM: 3.9 mmol/L (ref 3.5–5.1)
Sodium: 136 mmol/L (ref 135–145)
TOTAL PROTEIN: 7.8 g/dL (ref 6.5–8.1)

## 2018-04-03 LAB — CBC
HEMATOCRIT: 34.5 % — AB (ref 36.0–46.0)
Hemoglobin: 11.4 g/dL — ABNORMAL LOW (ref 12.0–15.0)
MCH: 26.1 pg (ref 26.0–34.0)
MCHC: 33 g/dL (ref 30.0–36.0)
MCV: 79.1 fL (ref 78.0–100.0)
PLATELETS: 421 10*3/uL — AB (ref 150–400)
RBC: 4.36 MIL/uL (ref 3.87–5.11)
RDW: 13.3 % (ref 11.5–15.5)
WBC: 4.6 10*3/uL (ref 4.0–10.5)

## 2018-04-06 ENCOUNTER — Ambulatory Visit: Payer: Commercial Managed Care - PPO | Admitting: Obstetrics and Gynecology

## 2018-04-07 ENCOUNTER — Ambulatory Visit (HOSPITAL_COMMUNITY)
Admission: RE | Admit: 2018-04-07 | Discharge: 2018-04-08 | Disposition: A | Discharge status: Home / Self Care | Payer: Commercial Managed Care - PPO | Source: Ambulatory Visit | Attending: Obstetrics and Gynecology | Admitting: Obstetrics and Gynecology

## 2018-04-07 ENCOUNTER — Encounter (HOSPITAL_COMMUNITY)
Admission: RE | Disposition: A | Discharge status: Home / Self Care | Payer: Self-pay | Source: Ambulatory Visit | Attending: Obstetrics and Gynecology

## 2018-04-07 ENCOUNTER — Ambulatory Visit (HOSPITAL_COMMUNITY): Payer: Commercial Managed Care - PPO | Admitting: Anesthesiology

## 2018-04-07 ENCOUNTER — Other Ambulatory Visit: Payer: Self-pay

## 2018-04-07 ENCOUNTER — Encounter (HOSPITAL_COMMUNITY): Payer: Self-pay

## 2018-04-07 DIAGNOSIS — D259 Leiomyoma of uterus, unspecified: Secondary | ICD-10-CM | POA: Insufficient documentation

## 2018-04-07 DIAGNOSIS — D649 Anemia, unspecified: Secondary | ICD-10-CM | POA: Diagnosis not present

## 2018-04-07 DIAGNOSIS — Z803 Family history of malignant neoplasm of breast: Secondary | ICD-10-CM | POA: Diagnosis not present

## 2018-04-07 DIAGNOSIS — Z90710 Acquired absence of both cervix and uterus: Secondary | ICD-10-CM | POA: Diagnosis present

## 2018-04-07 DIAGNOSIS — N736 Female pelvic peritoneal adhesions (postinfective): Secondary | ICD-10-CM | POA: Insufficient documentation

## 2018-04-07 DIAGNOSIS — Z833 Family history of diabetes mellitus: Secondary | ICD-10-CM | POA: Insufficient documentation

## 2018-04-07 DIAGNOSIS — Z8249 Family history of ischemic heart disease and other diseases of the circulatory system: Secondary | ICD-10-CM | POA: Insufficient documentation

## 2018-04-07 DIAGNOSIS — N72 Inflammatory disease of cervix uteri: Secondary | ICD-10-CM | POA: Insufficient documentation

## 2018-04-07 DIAGNOSIS — N921 Excessive and frequent menstruation with irregular cycle: Secondary | ICD-10-CM | POA: Insufficient documentation

## 2018-04-07 DIAGNOSIS — Z6841 Body Mass Index (BMI) 40.0 and over, adult: Secondary | ICD-10-CM | POA: Diagnosis not present

## 2018-04-07 DIAGNOSIS — N838 Other noninflammatory disorders of ovary, fallopian tube and broad ligament: Secondary | ICD-10-CM | POA: Insufficient documentation

## 2018-04-07 HISTORY — PX: LAPAROSCOPIC LYSIS OF ADHESIONS: SHX5905

## 2018-04-07 HISTORY — PX: TOTAL LAPAROSCOPIC HYSTERECTOMY WITH SALPINGECTOMY: SHX6742

## 2018-04-07 HISTORY — PX: CYSTOSCOPY: SHX5120

## 2018-04-07 LAB — HCG, SERUM, QUALITATIVE: Preg, Serum: NEGATIVE

## 2018-04-07 SURGERY — HYSTERECTOMY, TOTAL, LAPAROSCOPIC, WITH SALPINGECTOMY
Anesthesia: General

## 2018-04-07 MED ORDER — ONDANSETRON HCL 4 MG/2ML IJ SOLN
4.0000 mg | Freq: Once | INTRAMUSCULAR | Status: AC
  Administered 2018-04-07: 4 mg via INTRAVENOUS

## 2018-04-07 MED ORDER — PROMETHAZINE HCL 25 MG/ML IJ SOLN
12.5000 mg | INTRAMUSCULAR | Status: DC | PRN
  Administered 2018-04-07: 12.5 mg via INTRAVENOUS
  Filled 2018-04-07: qty 1

## 2018-04-07 MED ORDER — ONDANSETRON HCL 4 MG/2ML IJ SOLN
INTRAMUSCULAR | Status: AC
  Filled 2018-04-07: qty 2

## 2018-04-07 MED ORDER — SOD CITRATE-CITRIC ACID 500-334 MG/5ML PO SOLN
30.0000 mL | ORAL | Status: AC
  Administered 2018-04-07: 30 mL via ORAL

## 2018-04-07 MED ORDER — SODIUM CHLORIDE 0.9 % IV SOLN
INTRAVENOUS | Status: AC
  Filled 2018-04-07: qty 2

## 2018-04-07 MED ORDER — GLYCOPYRROLATE 0.2 MG/ML IJ SOLN
INTRAMUSCULAR | Status: DC | PRN
  Administered 2018-04-07 (×2): 0.1 mg via INTRAVENOUS

## 2018-04-07 MED ORDER — ONDANSETRON HCL 4 MG/2ML IJ SOLN
INTRAMUSCULAR | Status: DC | PRN
  Administered 2018-04-07 (×2): 2 mg via INTRAVENOUS

## 2018-04-07 MED ORDER — ROCURONIUM BROMIDE 100 MG/10ML IV SOLN
INTRAVENOUS | Status: AC
  Filled 2018-04-07: qty 1

## 2018-04-07 MED ORDER — ENOXAPARIN SODIUM 40 MG/0.4ML ~~LOC~~ SOLN
40.0000 mg | SUBCUTANEOUS | Status: AC
  Administered 2018-04-07: 40 mg via SUBCUTANEOUS

## 2018-04-07 MED ORDER — HYDROMORPHONE HCL 1 MG/ML IJ SOLN: 0.2500 mg | INTRAMUSCULAR | Status: DC | PRN

## 2018-04-07 MED ORDER — KETOROLAC TROMETHAMINE 30 MG/ML IJ SOLN
30.0000 mg | Freq: Four times a day (QID) | INTRAMUSCULAR | Status: DC
  Administered 2018-04-07 – 2018-04-08 (×2): 30 mg via INTRAVENOUS
  Filled 2018-04-07 (×2): qty 1

## 2018-04-07 MED ORDER — PROPOFOL 10 MG/ML IV BOLUS
INTRAVENOUS | Status: DC | PRN
  Administered 2018-04-07: 200 mg via INTRAVENOUS

## 2018-04-07 MED ORDER — SUGAMMADEX SODIUM 200 MG/2ML IV SOLN
INTRAVENOUS | Status: AC
Start: 2018-04-07 — End: ?
  Filled 2018-04-07: qty 2

## 2018-04-07 MED ORDER — ENOXAPARIN SODIUM 40 MG/0.4ML ~~LOC~~ SOLN
SUBCUTANEOUS | Status: AC
  Administered 2018-04-07: 40 mg via SUBCUTANEOUS
  Filled 2018-04-07: qty 0.4

## 2018-04-07 MED ORDER — MIDAZOLAM HCL 2 MG/2ML IJ SOLN
INTRAMUSCULAR | Status: AC
  Filled 2018-04-07: qty 2

## 2018-04-07 MED ORDER — SCOPOLAMINE 1 MG/3DAYS TD PT72
1.0000 | MEDICATED_PATCH | Freq: Once | TRANSDERMAL | Status: DC
  Administered 2018-04-07: 1.5 mg via TRANSDERMAL

## 2018-04-07 MED ORDER — ONDANSETRON HCL 4 MG/2ML IJ SOLN: 4.0000 mg | Freq: Four times a day (QID) | INTRAMUSCULAR | Status: DC | PRN

## 2018-04-07 MED ORDER — LIDOCAINE HCL (CARDIAC) PF 100 MG/5ML IV SOSY
PREFILLED_SYRINGE | INTRAVENOUS | Status: DC | PRN
  Administered 2018-04-07: 100 mg via INTRAVENOUS

## 2018-04-07 MED ORDER — ONDANSETRON HCL 4 MG PO TABS: 4.0000 mg | ORAL_TABLET | Freq: Four times a day (QID) | ORAL | Status: DC | PRN

## 2018-04-07 MED ORDER — STERILE WATER FOR IRRIGATION IR SOLN
Status: DC | PRN
  Administered 2018-04-07: 1000 mL via INTRAVESICAL

## 2018-04-07 MED ORDER — SODIUM CHLORIDE 0.9 % IR SOLN
Status: DC | PRN
  Administered 2018-04-07: 3000 mL

## 2018-04-07 MED ORDER — PROPOFOL 10 MG/ML IV BOLUS
INTRAVENOUS | Status: AC
  Filled 2018-04-07: qty 20

## 2018-04-07 MED ORDER — KETOROLAC TROMETHAMINE 30 MG/ML IJ SOLN: 30.0000 mg | Freq: Four times a day (QID) | INTRAMUSCULAR | Status: DC

## 2018-04-07 MED ORDER — MEPERIDINE HCL 25 MG/ML IJ SOLN: 6.2500 mg | INTRAMUSCULAR | Status: DC | PRN

## 2018-04-07 MED ORDER — KETOROLAC TROMETHAMINE 30 MG/ML IJ SOLN: 30.0000 mg | Freq: Once | INTRAMUSCULAR | Status: DC | PRN

## 2018-04-07 MED ORDER — PROMETHAZINE HCL 25 MG/ML IJ SOLN: 6.2500 mg | INTRAMUSCULAR | Status: DC | PRN

## 2018-04-07 MED ORDER — BUPIVACAINE HCL (PF) 0.25 % IJ SOLN
INTRAMUSCULAR | Status: DC | PRN
  Administered 2018-04-07: 14 mL

## 2018-04-07 MED ORDER — SODIUM CHLORIDE 0.9 % IJ SOLN
INTRAMUSCULAR | Status: DC | PRN
  Administered 2018-04-07: 10 mL

## 2018-04-07 MED ORDER — OXYCODONE-ACETAMINOPHEN 5-325 MG PO TABS: 2.0000 | ORAL_TABLET | ORAL | Status: DC | PRN

## 2018-04-07 MED ORDER — SODIUM CHLORIDE 0.9 % IJ SOLN
INTRAMUSCULAR | Status: AC
  Filled 2018-04-07: qty 10

## 2018-04-07 MED ORDER — LACTATED RINGERS IV SOLN
INTRAVENOUS | Status: DC
  Administered 2018-04-07: 08:00:00 via INTRAVENOUS
  Administered 2018-04-07: 125 mL/h via INTRAVENOUS
  Administered 2018-04-07: 12:00:00 via INTRAVENOUS

## 2018-04-07 MED ORDER — ENOXAPARIN SODIUM 60 MG/0.6ML ~~LOC~~ SOLN
60.0000 mg | SUBCUTANEOUS | Status: DC
  Filled 2018-04-07: qty 0.6

## 2018-04-07 MED ORDER — SUGAMMADEX SODIUM 200 MG/2ML IV SOLN
INTRAVENOUS | Status: DC | PRN
  Administered 2018-04-07: 400 mg via INTRAVENOUS

## 2018-04-07 MED ORDER — HYDROMORPHONE HCL 1 MG/ML IJ SOLN
INTRAMUSCULAR | Status: AC
  Filled 2018-04-07: qty 1

## 2018-04-07 MED ORDER — FENTANYL CITRATE (PF) 250 MCG/5ML IJ SOLN
INTRAMUSCULAR | Status: AC
  Filled 2018-04-07: qty 5

## 2018-04-07 MED ORDER — DOCUSATE SODIUM 100 MG PO CAPS
100.0000 mg | ORAL_CAPSULE | Freq: Two times a day (BID) | ORAL | Status: DC
  Filled 2018-04-07: qty 1

## 2018-04-07 MED ORDER — ROPIVACAINE HCL 5 MG/ML IJ SOLN
INTRAMUSCULAR | Status: AC
  Filled 2018-04-07: qty 30

## 2018-04-07 MED ORDER — GLYCOPYRROLATE 0.2 MG/ML IJ SOLN
INTRAMUSCULAR | Status: AC
  Filled 2018-04-07: qty 1

## 2018-04-07 MED ORDER — BUPIVACAINE HCL (PF) 0.25 % IJ SOLN
INTRAMUSCULAR | Status: AC
  Filled 2018-04-07: qty 30

## 2018-04-07 MED ORDER — DEXAMETHASONE SODIUM PHOSPHATE 10 MG/ML IJ SOLN
INTRAMUSCULAR | Status: AC
  Filled 2018-04-07: qty 1

## 2018-04-07 MED ORDER — SODIUM CHLORIDE 0.9 % IV SOLN
2.0000 g | INTRAVENOUS | Status: AC
  Administered 2018-04-07: 2 g via INTRAVENOUS

## 2018-04-07 MED ORDER — SODIUM CHLORIDE 0.9 % IJ SOLN
INTRAMUSCULAR | Status: AC
  Filled 2018-04-07: qty 50

## 2018-04-07 MED ORDER — HYDROMORPHONE HCL 1 MG/ML IJ SOLN
0.2000 mg | INTRAMUSCULAR | Status: DC | PRN
  Administered 2018-04-08: 0.6 mg via INTRAVENOUS
  Filled 2018-04-07: qty 1

## 2018-04-07 MED ORDER — SODIUM CHLORIDE 0.9 % IV SOLN
INTRAVENOUS | Status: DC | PRN
  Administered 2018-04-07: 60 mL

## 2018-04-07 MED ORDER — HYDROMORPHONE HCL 1 MG/ML IJ SOLN
INTRAMUSCULAR | Status: DC | PRN
  Administered 2018-04-07 (×2): 1 mg via INTRAVENOUS

## 2018-04-07 MED ORDER — ALUM & MAG HYDROXIDE-SIMETH 200-200-20 MG/5ML PO SUSP: 30.0000 mL | ORAL | Status: DC | PRN

## 2018-04-07 MED ORDER — LIDOCAINE HCL (CARDIAC) PF 100 MG/5ML IV SOSY
PREFILLED_SYRINGE | INTRAVENOUS | Status: AC
  Filled 2018-04-07: qty 5

## 2018-04-07 MED ORDER — MIDAZOLAM HCL 2 MG/2ML IJ SOLN
INTRAMUSCULAR | Status: DC | PRN
  Administered 2018-04-07: 1.5 mg via INTRAVENOUS
  Administered 2018-04-07: 0.5 mg via INTRAVENOUS

## 2018-04-07 MED ORDER — ROCURONIUM BROMIDE 100 MG/10ML IV SOLN
INTRAVENOUS | Status: DC | PRN
  Administered 2018-04-07: 60 mg via INTRAVENOUS
  Administered 2018-04-07 (×3): 10 mg via INTRAVENOUS

## 2018-04-07 MED ORDER — KCL IN DEXTROSE-NACL 20-5-0.45 MEQ/L-%-% IV SOLN
INTRAVENOUS | Status: DC
  Administered 2018-04-07 – 2018-04-08 (×2): via INTRAVENOUS
  Filled 2018-04-07 (×3): qty 1000

## 2018-04-07 MED ORDER — SCOPOLAMINE 1 MG/3DAYS TD PT72
MEDICATED_PATCH | TRANSDERMAL | Status: AC
  Administered 2018-04-07: 1.5 mg via TRANSDERMAL
  Filled 2018-04-07: qty 1

## 2018-04-07 MED ORDER — ACETAMINOPHEN 325 MG PO TABS: 650.0000 mg | ORAL_TABLET | ORAL | Status: DC | PRN

## 2018-04-07 MED ORDER — ZOLPIDEM TARTRATE 5 MG PO TABS
5.0000 mg | ORAL_TABLET | Freq: Every evening | ORAL | Status: DC | PRN
Start: 2018-04-07 — End: 2018-04-08

## 2018-04-07 MED ORDER — KETOROLAC TROMETHAMINE 30 MG/ML IJ SOLN
INTRAMUSCULAR | Status: AC
  Filled 2018-04-07: qty 1

## 2018-04-07 MED ORDER — TRAMADOL HCL 50 MG PO TABS
50.0000 mg | ORAL_TABLET | Freq: Four times a day (QID) | ORAL | Status: DC | PRN
  Administered 2018-04-07: 50 mg via ORAL
  Filled 2018-04-07: qty 1

## 2018-04-07 MED ORDER — SOD CITRATE-CITRIC ACID 500-334 MG/5ML PO SOLN
ORAL | Status: AC
  Administered 2018-04-07: 30 mL via ORAL
  Filled 2018-04-07: qty 15

## 2018-04-07 MED ORDER — DEXAMETHASONE SODIUM PHOSPHATE 10 MG/ML IJ SOLN
INTRAMUSCULAR | Status: DC | PRN
  Administered 2018-04-07: 10 mg via INTRAVENOUS

## 2018-04-07 MED ORDER — ONDANSETRON HCL 4 MG/2ML IJ SOLN
INTRAMUSCULAR | Status: AC
  Administered 2018-04-07: 4 mg via INTRAVENOUS
  Filled 2018-04-07: qty 2

## 2018-04-07 MED ORDER — FENTANYL CITRATE (PF) 100 MCG/2ML IJ SOLN
INTRAMUSCULAR | Status: DC | PRN
  Administered 2018-04-07 (×3): 100 ug via INTRAVENOUS
  Administered 2018-04-07: 50 ug via INTRAVENOUS

## 2018-04-07 MED ORDER — SODIUM CHLORIDE 0.9 % IJ SOLN
INTRAMUSCULAR | Status: AC
  Filled 2018-04-07: qty 20

## 2018-04-07 MED ORDER — SUGAMMADEX SODIUM 200 MG/2ML IV SOLN
INTRAVENOUS | Status: AC
  Filled 2018-04-07: qty 2

## 2018-04-07 MED ORDER — MENTHOL 3 MG MT LOZG: 1.0000 | LOZENGE | OROMUCOSAL | Status: DC | PRN

## 2018-04-07 MED ORDER — KETOROLAC TROMETHAMINE 30 MG/ML IJ SOLN
INTRAMUSCULAR | Status: DC | PRN
  Administered 2018-04-07: 30 mg via INTRAVENOUS

## 2018-04-07 SURGICAL SUPPLY — 50 items
APPLICATOR ARISTA FLEXITIP XL (MISCELLANEOUS) ×4 IMPLANT
APPLICATOR COTTON TIP 6 STRL (MISCELLANEOUS) ×3 IMPLANT
APPLICATOR COTTON TIP 6IN STRL (MISCELLANEOUS) ×4
BLADE SURG 10 STRL SS (BLADE) ×8 IMPLANT
CABLE HIGH FREQUENCY MONO STRZ (ELECTRODE) ×4 IMPLANT
CANISTER SUCT 3000ML PPV (MISCELLANEOUS) ×4 IMPLANT
COVER LIGHT HANDLE  1/PK (MISCELLANEOUS) ×1
COVER LIGHT HANDLE 1/PK (MISCELLANEOUS) ×3 IMPLANT
COVER MAYO STAND STRL (DRAPES) ×4 IMPLANT
DECANTER SPIKE VIAL GLASS SM (MISCELLANEOUS) ×12 IMPLANT
DEFOGGER SCOPE WARMER CLEARIFY (MISCELLANEOUS) ×4 IMPLANT
DERMABOND ADVANCED (GAUZE/BANDAGES/DRESSINGS) ×1
DERMABOND ADVANCED .7 DNX12 (GAUZE/BANDAGES/DRESSINGS) ×3 IMPLANT
DURAPREP 26ML APPLICATOR (WOUND CARE) ×4 IMPLANT
GLOVE BIOGEL PI IND STRL 7.0 (GLOVE) ×12 IMPLANT
GLOVE BIOGEL PI INDICATOR 7.0 (GLOVE) ×4
GLOVE ECLIPSE 6.5 STRL STRAW (GLOVE) ×4 IMPLANT
GOWN STRL REUS W/TWL LRG LVL3 (GOWN DISPOSABLE) ×16 IMPLANT
HARMONIC RUM II 3.0CM SILVER (DISPOSABLE) ×4
HEMOSTAT ARISTA ABSORB 3G PWDR (MISCELLANEOUS) ×4 IMPLANT
LIGASURE VESSEL 5MM BLUNT TIP (ELECTROSURGICAL) ×4 IMPLANT
NEEDLE INSUFFLATION 120MM (ENDOMECHANICALS) ×4 IMPLANT
PACK LAPAROSCOPY BASIN (CUSTOM PROCEDURE TRAY) ×4 IMPLANT
PACK TRENDGUARD 450 HYBRID PRO (MISCELLANEOUS) ×3 IMPLANT
PACK TRENDGUARD 600 HYBRD PROC (MISCELLANEOUS) ×3 IMPLANT
POUCH LAPAROSCOPIC INSTRUMENT (MISCELLANEOUS) ×4 IMPLANT
PROTECTOR NERVE ULNAR (MISCELLANEOUS) ×8 IMPLANT
SCALPEL HRMNC RUM II 3.0 SILVR (DISPOSABLE) ×3 IMPLANT
SCISSORS LAP 5X35 DISP (ENDOMECHANICALS) ×8 IMPLANT
SET CYSTO W/LG BORE CLAMP LF (SET/KITS/TRAYS/PACK) ×8 IMPLANT
SET IRRIG TUBING LAPAROSCOPIC (IRRIGATION / IRRIGATOR) ×4 IMPLANT
SET TRI-LUMEN FLTR TB AIRSEAL (TUBING) ×4 IMPLANT
SHEARS HARMONIC ACE PLUS 36CM (ENDOMECHANICALS) ×4 IMPLANT
SUT VIC AB 0 CT1 27 (SUTURE) ×2
SUT VIC AB 0 CT1 27XBRD ANBCTR (SUTURE) ×6 IMPLANT
SUT VICRYL 0 UR6 27IN ABS (SUTURE) ×4 IMPLANT
SUT VICRYL 4-0 PS2 18IN ABS (SUTURE) ×4 IMPLANT
SYR 50ML LL SCALE MARK (SYRINGE) ×8 IMPLANT
TIP UTERINE 6.7X10CM GRN DISP (MISCELLANEOUS) ×4 IMPLANT
TOWEL OR 17X24 6PK STRL BLUE (TOWEL DISPOSABLE) ×8 IMPLANT
TRAY FOLEY W/BAG SLVR 14FR (SET/KITS/TRAYS/PACK) ×4 IMPLANT
TRENDGUARD 450 HYBRID PRO PACK (MISCELLANEOUS) ×4
TRENDGUARD 600 HYBRID PROC PK (MISCELLANEOUS) ×4
TROCAR ADV FIXATION 5X100MM (TROCAR) ×4 IMPLANT
TROCAR PORT AIRSEAL 5X120 (TROCAR) ×4 IMPLANT
TROCAR XCEL NON BLADE 8MM B8LT (ENDOMECHANICALS) ×4 IMPLANT
TROCAR XCEL NON-BLD 5MMX100MML (ENDOMECHANICALS) ×4 IMPLANT
TUBING NON-CON 1/4 X 20 CONN (TUBING) ×4 IMPLANT
WARMER LAPAROSCOPE (MISCELLANEOUS) ×4 IMPLANT
YANKAUER SUCT BULB TIP NO VENT (SUCTIONS) ×4 IMPLANT

## 2018-04-07 NOTE — Anesthesia Procedure Notes (Signed)
Procedure Name: Intubation Date/Time: 04/07/2018 7:35 AM Performed by: Lyn Hollingshead, MD Pre-anesthesia Checklist: Patient identified, Patient being monitored, Timeout performed, Emergency Drugs available and Suction available Patient Re-evaluated:Patient Re-evaluated prior to induction Oxygen Delivery Method: Circle System Utilized Preoxygenation: Pre-oxygenation with 100% oxygen Induction Type: IV induction Ventilation: Mask ventilation without difficulty Laryngoscope Size: Miller and 2 Grade View: Grade II Tube type: Oral Tube size: 7.0 mm Number of attempts: 1 Airway Equipment and Method: stylet Placement Confirmation: ETT inserted through vocal cords under direct vision,  positive ETCO2 and breath sounds checked- equal and bilateral Secured at: 22 cm Tube secured with: Tape Dental Injury: Teeth and Oropharynx as per pre-operative assessment

## 2018-04-07 NOTE — Transfer of Care (Signed)
3Immediate Anesthesia Transfer of Care Note  Patient: [name]  Procedure(s) Performed: TOTAL LAPAROSCOPIC HYSTERECTOMY WITH SALPINGECTOMY (Bilateral ) CYSTOSCOPY (N/A ) EXTENSIVE LAPAROSCOPIC LYSIS OF ADHESIONS  Patient Location: PACU  Anesthesia Type:General  Level of Consciousness: awake, alert  and oriented  Airway & Oxygen Therapy: Patient Spontanous Breathing and Patient connected to face mask oxygen  Post-op Assessment: Report given to RN, Post -op Vital signs reviewed and stable and Patient moving all extremities X 4  Post vital signs: Reviewed and stable  Last Vitals:  Vitals Value Taken Time  BP 118/73 04/07/2018 12:00 PM  Temp    Pulse 165 04/07/2018 12:00 PM  Resp 20 04/07/2018 12:00 PM  SpO2 100 % 04/07/2018 12:00 PM  Vitals shown include unvalidated device data.  Last Pain:  Vitals:   04/07/18 0609  TempSrc: Oral      Patients Stated Pain Goal: 3 (32/54/98 2641)  Complications: No apparent anesthesia complications

## 2018-04-07 NOTE — Anesthesia Postprocedure Evaluation (Signed)
Anesthesia Post Note  Patient: [name]  Procedure(s) Performed: TOTAL LAPAROSCOPIC HYSTERECTOMY WITH SALPINGECTOMY (Bilateral ) CYSTOSCOPY (N/A ) EXTENSIVE LAPAROSCOPIC LYSIS OF ADHESIONS     Patient location during evaluation: PACU Anesthesia Type: General Level of consciousness: sedated Pain management: pain level controlled Vital Signs Assessment: post-procedure vital signs reviewed and stable Respiratory status: spontaneous breathing Cardiovascular status: stable Postop Assessment: no apparent nausea or vomiting Anesthetic complications: no    Last Vitals:  Vitals:   04/07/18 1247 04/07/18 1300  BP:  102/65  Pulse: 78 75  Resp: 15 10  Temp:    SpO2: 100% 100%    Last Pain:  Vitals:   04/07/18 1300  TempSrc:   PainSc: 0-No pain   Pain Goal: Patients Stated Pain Goal: 3 (04/07/18 0609)               Sarajane Fambrough JR,JOHN Mateo Flow

## 2018-04-07 NOTE — Op Note (Addendum)
Preoperative Diagnosis: Symptomatic fibroid uterus, mild anemia   Postoperative Diagnosis: Symptomatic fibroid uterus, mild anemia, extensive adhesions  Procedure:  Total Laparoscopic Hysterectomy, bilateral salpingectomies, extensive lysis of adhesions and cystoscopy  Surgeon: Dr Sumner Boast  Assistant: Dr Edwinna Areola  Anesthesia: General  EBL: 350  Fluids: 2,000 LR  Urine output: 250 cc  Uterine weight: 875.6 grams  Complications: none  Indications for surgery: The patient is a 37 year old female, who presented with complaints of dysmenorrhea and menometrorrhagia leading to mild anemia. Work up included an ultrasound that revealed a fibroid uterus. Endometrial biopsy was benign. The patient desired definitive treatment.  The patient is aware of the risks and complications involved with the surgery and consent was obtained prior to the procedure.  Findings: EUA: enlarged fibroid uterus. Laparoscopy: enlarged fibroid uterus, normal adnexa bilaterally. She had omental adhesions to her anterior abdominal wall and significant adhesions of her bladder to her uterus and LUS. Normal liver edge, normal appendix.   Procedure: The patient was taken to the operating room with an IV in placed, preoperative antibiotics had been administered. She was placed in the dorsal lithotomy position. General anesthesia was administered. She was prepped and draped in the usual sterile fashion for an abdominal, vaginal surgery. A rumi uterine manipulator was placed, using a # 3 cup and a 10 cm extender. A foley catheter was placed.    The umbilicus was everted, injected with 0.25% marcaine and incised with a # 11 blade. 2 towel clips were used to elevated the umbilicus and a veress needle was placed into the abdominal cavity. The abdominal cavity was insufflated with CO2, with normal intraabdominal pressures. After adequate pneumo-insufflation the veress needle was removed and the 5 mm laparoscope was  placed into the abdominal cavity using the opti-view trocar. The patient was placed in trendelenburg and the abdominal pelvic cavity was inspected. 3 more trocars were placed: 1 in each lower quadrant approximately 3 cm medial to and superior to the anterior superior iliac spine and one in the midline approximately 6 cm above the pubic symphysis in the midline. These areas were injected with 0.25% marcaine, incised with a #11 blade and all trocars were inserted with direct visualization with the laparoscope. A # 5 airseal trocar was placed in the RLQ, a 5 mm trocar in the LLQ and a #8 trocar in the midline. Prior to placing the midline #8 trocar the omental adhesions were taken down with the harmonic scalpel. The abdominal pelvic cavity was again inspected. A mixture of 30 cc of Robivacaine and 30 cc of NS was place in the pelvic cavity. The ureters were identified bilaterally.  The left tube was elevated from the pelvic sidewall, cauterized and cut with the ligasure device. The mesosalpinx was cauterized and cut with the ligasure device. The tube was separated from the uterus using the ligasure device and removed through the midline trocar. The left utero-ovarian ligament was cauterized and cut with the ligasure device.  The left round ligament was cauterized and cut with the ligasure device and the anterior and posterior leafs of the broad ligament were taken down with the ligasure device. The same procedure was repeated on the right. The bladder was densely adherent to the uterus. The adhesions were taken down with a combination of sharp dissection, blunt dissection and use of the harmonic scalpel. The bladder was filled and drained during this process. The harmonic scalpel was used to skeltonize the vessels on the left and the vessels were  then clamped, cauterized and ligated with the ligasure device. There was more scarring of the bladder to the left lower uterine segment, in the process of taking these  adhesions down a branch of the uterine vessel was entered, this was stopped with careful use of the ligasure device. The right uterine vessels were then clamped, cauterized and ligated with the ligasure device.  Using the rumi manipulator the uterus was pushed up in the pelvic cavity and the harmonic scalpel was used to separate the cervix from the vagina using the harmonic energy. The uterus was morcellated and  removed vaginally at this time. The vaginal cuff was closed vaginally using a running locked stitch of 0-Vicryl.  Hemostasis was excellent. The abdominal pelvic cavity was inspected, irrigated and suctioned dry. Pressure was released and hemostasis remained excellent.   The abdominal cavity was desufflated and the trocars were removed. The skin was closed with subcuticular stiches of 4-0 vicryl and dermabond was placed over the incisions.  The foley catheter was removed and cystoscopy was performed using a 70 degree scope. Both ureters expelled urine, no bladder abnormalities were noted. The bladder was allowed to drain and the cystoscope was removed.   The patient's abdomen and perineum were cleansed and she was taken out of the dorsal lithotomy position. Upon awakening she was extubated and taken to the recovery room in stable condition. The sponge and instrument counts were correct.   This surgery was lengthened from the expected 2 hours to 3 hours and 15 minutes secondary to the extensive adhesions.

## 2018-04-07 NOTE — Interval H&P Note (Signed)
History and Physical Interval Note:  04/07/2018 7:15 AM  [name]  has presented today for surgery, with the diagnosis of AUB, uterine fibroids, anemia, hx ASCUS-H pap  The various methods of treatment have been discussed with the patient and family. After consideration of risks, benefits and other options for treatment, the patient has consented to  Procedure(s) with comments: TOTAL LAPAROSCOPIC HYSTERECTOMY WITH SALPINGECTOMY (Bilateral) - 3 hours OR time. BMI 51.21 CYSTOSCOPY  possible (N/A) - possible as a surgical intervention .  The patient's history has been reviewed, patient examined, no change in status, stable for surgery.  I have reviewed the patient's chart and labs.  Questions were answered to the patient's satisfaction.     Salvadore Dom

## 2018-04-07 NOTE — Progress Notes (Signed)
Day of Surgery Procedure(s) (LRB): TOTAL LAPAROSCOPIC HYSTERECTOMY WITH SALPINGECTOMY (Bilateral) CYSTOSCOPY (N/A) EXTENSIVE LAPAROSCOPIC LYSIS OF ADHESIONS  Subjective: Patient reports nausea, no emesis. She has ambulated, not able to void, was catheterized. Pain is okay on medication.  Objective: I have reviewed patient's vital signs and intake and output.  Today's Vitals   04/07/18 1500 04/07/18 1530 04/07/18 1655 04/07/18 1658  BP:  125/80  123/75  Pulse:  85  86  Resp:  16  18  Temp:  97.6 F (36.4 C)  98.1 F (36.7 C)  TempSrc:  Oral  Oral  SpO2:  100%  100%  Weight: 277 lb (125.6 kg)     Height: 5\' 2"  (1.575 m)     PainSc:  0-No pain 5      No intake/output data recorded. Total I/O In: 2385.4 [I.V.:2385.4] Out: 600 [Urine:250; Blood:350] Just catheterized for 500 cc   General: alert, cooperative and no distress Resp: clear to auscultation bilaterally Cardio: S1, S2 normal GI: soft, appropriately tender, decreased BS, mildly distended. Incisions: clean, dry and intact without erythema.  Extremities: extremities normal, atraumatic, no cyanosis or edema  Assessment: s/p Procedure(s) with comments: TOTAL LAPAROSCOPIC HYSTERECTOMY WITH SALPINGECTOMY (Bilateral) - 3 hours OR time. BMI 51.21 CYSTOSCOPY (N/A) EXTENSIVE LAPAROSCOPIC LYSIS OF ADHESIONS: stable, not tolerating diet and urinary retention  Plan: Encourage ambulation will treat nausea. Advance diet as tolerated  LOS: 0 days    Salvadore Dom 04/07/2018, 5:46 PM

## 2018-04-07 NOTE — Anesthesia Preprocedure Evaluation (Addendum)
Anesthesia Evaluation  Patient identified by MRN, date of birth, ID band Patient awake    Reviewed: Allergy & Precautions, NPO status , Patient's Chart, lab work & pertinent test results  Airway Mallampati: I       Dental no notable dental hx. (+) Teeth Intact   Pulmonary neg pulmonary ROS,    Pulmonary exam normal breath sounds clear to auscultation       Cardiovascular Normal cardiovascular exam Rhythm:Regular Rate:Normal     Neuro/Psych negative neurological ROS  negative psych ROS   GI/Hepatic negative GI ROS, Neg liver ROS,   Endo/Other  negative endocrine ROSMorbid obesity  Renal/GU negative Renal ROS  negative genitourinary   Musculoskeletal negative musculoskeletal ROS (+)   Abdominal (+) + obese,   Peds  Hematology  (+) anemia ,   Anesthesia Other Findings   Reproductive/Obstetrics negative OB ROS                            Anesthesia Physical Anesthesia Plan  ASA: III  Anesthesia Plan: General   Post-op Pain Management:    Induction: Intravenous  PONV Risk Score and Plan: 4 or greater and Ondansetron, Dexamethasone, Midazolam and Scopolamine patch - Pre-op  Airway Management Planned: Oral ETT  Additional Equipment:   Intra-op Plan:   Post-operative Plan: Extubation in OR  Informed Consent: I have reviewed the patients History and Physical, chart, labs and discussed the procedure including the risks, benefits and alternatives for the proposed anesthesia with the patient or authorized representative who has indicated his/her understanding and acceptance.   Dental advisory given  Plan Discussed with: CRNA and Surgeon  Anesthesia Plan Comments:         Anesthesia Quick Evaluation

## 2018-04-08 ENCOUNTER — Encounter (HOSPITAL_COMMUNITY): Payer: Self-pay | Admitting: Obstetrics and Gynecology

## 2018-04-08 DIAGNOSIS — D259 Leiomyoma of uterus, unspecified: Secondary | ICD-10-CM | POA: Diagnosis not present

## 2018-04-08 LAB — CBC
HEMATOCRIT: 28.5 % — AB (ref 36.0–46.0)
Hemoglobin: 9.3 g/dL — ABNORMAL LOW (ref 12.0–15.0)
MCH: 26.1 pg (ref 26.0–34.0)
MCHC: 32.6 g/dL (ref 30.0–36.0)
MCV: 80.1 fL (ref 78.0–100.0)
PLATELETS: 428 10*3/uL — AB (ref 150–400)
RBC: 3.56 MIL/uL — ABNORMAL LOW (ref 3.87–5.11)
RDW: 13.2 % (ref 11.5–15.5)
WBC: 8.9 10*3/uL (ref 4.0–10.5)

## 2018-04-08 MED ORDER — DOCUSATE SODIUM 100 MG PO CAPS: 100.0000 mg | ORAL_CAPSULE | Freq: Two times a day (BID) | ORAL | 0 refills | Status: DC

## 2018-04-08 MED ORDER — ACETAMINOPHEN 325 MG PO TABS: 650.0000 mg | ORAL_TABLET | ORAL | 0 refills | Status: DC | PRN

## 2018-04-08 MED ORDER — TRAMADOL HCL 50 MG PO TABS: ORAL_TABLET | ORAL | 0 refills | Status: DC

## 2018-04-08 NOTE — Progress Notes (Signed)
Discharge instructions reviewed with patient including medication changes, postoperative care and follow up appointments.  Questions answered and IV removed. Paperwork signed at this time.

## 2018-04-08 NOTE — Discharge Summary (Signed)
Physician Discharge Summary  Patient ID: [name] MRN: [id number] DOB/AGE: May 07, 1981 37 y.o.  Admit date: 04/07/2018 Discharge date: 04/08/2018  Admission Diagnoses: Symptomatic fibroid uterus  Discharge Diagnoses: Symptomatic fibroid uterus Active Problems:   Status post laparoscopic hysterectomy   Discharged Condition: good  Hospital Course: Uncomplicated.   Consults: None  Today's Vitals   04/08/18 0300 04/08/18 0400 04/08/18 0512 04/08/18 0534  BP:   115/72   Pulse:   86   Resp:   16   Temp:   98.6 F (37 C)   TempSrc:   Oral   SpO2:   96%   Weight:      Height:      PainSc: Asleep 0-No pain  0-No pain    I/O last 3 completed shifts: In: 2625.4 [P.O.:240; I.V.:2385.4] Out: 2400 [Urine:2050; Blood:350] No intake/output data recorded.  Lab Results  Component Value Date   WBC 8.9 04/08/2018   HGB 9.3 (L) 04/08/2018   HCT 28.5 (L) 04/08/2018   MCV 80.1 04/08/2018   PLT 428 (H) 04/08/2018     Discharge Exam: Blood pressure 115/72, pulse 86, temperature 98.6 F (37 C), temperature source Oral, resp. rate 16, height 5\' 2"  (1.575 m), weight 277 lb (125.6 kg), last menstrual period 03/31/2018, SpO2 96 %. General appearance: alert and no distress Resp: clear to auscultation bilaterally Cardio: S1, S2 normal GI: soft, not tender, +BS, mildly distended. Incisions are clean, dry and intact without erythema Extremities: extremities normal, atraumatic, no cyanosis or edema  Vaginal pad: no blood  Disposition: Discharge disposition: 01-Home or Self Care       Discharge Instructions    Call MD for:   Complete by:  As directed    Heavy vaginal bleeding   Call MD for:  difficulty breathing, headache or visual disturbances   Complete by:  As directed    Call MD for:  extreme fatigue   Complete by:  As directed    Call MD for:  hives   Complete by:  As directed    Call MD for:  persistant dizziness or light-headedness   Complete by:  As directed    Call MD for:  persistant nausea and vomiting   Complete by:  As directed    Call MD for:  redness, tenderness, or signs of infection (pain, swelling, redness, odor or green/yellow discharge around incision site)   Complete by:  As directed    Call MD for:  severe uncontrolled pain   Complete by:  As directed    Call MD for:  temperature >100.4   Complete by:  As directed    Diet - low sodium heart healthy   Complete by:  As directed    Driving Restrictions   Complete by:  As directed    No driving while taking tramadol   Increase activity slowly   Complete by:  As directed    May shower / Bathe   Complete by:  As directed    May shower   May walk up steps   Complete by:  As directed    No dressing needed   Complete by:  As directed    Sexual Activity Restrictions   Complete by:  As directed    No intercourse for 8-12 weeks     Allergies as of 04/08/2018   No Known Allergies     Medication List    TAKE these medications   acetaminophen 325 MG tablet Commonly known as:  TYLENOL Take 2  tablets (650 mg total) by mouth every 4 (four) hours as needed for mild pain.   docusate sodium 100 MG capsule Commonly known as:  COLACE Take 1 capsule (100 mg total) by mouth 2 (two) times daily.   ferrous sulfate 325 (65 FE) MG tablet Take 325 mg by mouth at bedtime.   traMADol 50 MG tablet Commonly known as:  ULTRAM 1-2 tablets po q 6 hours prn pain        Signed: Salvadore Dom 04/08/2018, 7:54 AM

## 2018-04-10 ENCOUNTER — Ambulatory Visit (INDEPENDENT_AMBULATORY_CARE_PROVIDER_SITE_OTHER): Payer: Commercial Managed Care - PPO

## 2018-04-10 ENCOUNTER — Telehealth: Payer: Self-pay

## 2018-04-10 VITALS — BP 140/82 | HR 60 | Resp 14 | Ht 62.0 in | Wt 281.5 lb

## 2018-04-10 DIAGNOSIS — Z862 Personal history of diseases of the blood and blood-forming organs and certain disorders involving the immune mechanism: Secondary | ICD-10-CM

## 2018-04-10 DIAGNOSIS — R42 Dizziness and giddiness: Secondary | ICD-10-CM | POA: Diagnosis not present

## 2018-04-10 NOTE — Telephone Encounter (Signed)
Patient contacted and returned to the office today at 2 pm for a hemoglobin check with CBC and vital signs. Please see nurse visit note. Encounter closed.

## 2018-04-10 NOTE — Telephone Encounter (Signed)
If she is dizzy any time other that with narcotics, she should come in for vitals and a hgb. She is anemic.

## 2018-04-10 NOTE — Telephone Encounter (Signed)
-----   Message from Salvadore Dom, MD sent at 04/09/2018  6:17 PM EDT ----- Please call and check on the patient, s/p TLH on Tuesday. Please inform the patient that her pathology was all benign.

## 2018-04-10 NOTE — Progress Notes (Signed)
Patient here for a nurse visit due to light headedness post hysterectomy.  Hemoglobin is 9.6 Vital signs stable reviewed with JJ. Patient to return for routine post op visit.

## 2018-04-10 NOTE — Telephone Encounter (Signed)
Spoke with patient. Advised of results as seen below from [place]. Patient verbalizes understanding. States that she is feeling well. Is a little sore. Is taking Tramadol every 6 hours as needed and alternating with Tylenol. States she feels a little light headed occasionally with taking medication. Advised this can occur with taking Tramadol. Needs to remain really well hydrated and rest when this occurs. Denies and vision changes. Advised if this is severe or accompanied by any additional symptoms needs to contact the office. Patient is agreeable.  Routing to Dr.Jertson before closing.

## 2018-04-11 LAB — CBC
Hematocrit: 31.1 % — ABNORMAL LOW (ref 34.0–46.6)
Hemoglobin: 9.7 g/dL — ABNORMAL LOW (ref 11.1–15.9)
MCH: 25.9 pg — AB (ref 26.6–33.0)
MCHC: 31.2 g/dL — ABNORMAL LOW (ref 31.5–35.7)
MCV: 83 fL (ref 79–97)
PLATELETS: 444 10*3/uL (ref 150–450)
RBC: 3.74 x10E6/uL — AB (ref 3.77–5.28)
RDW: 15 % (ref 12.3–15.4)
WBC: 6.5 10*3/uL (ref 3.4–10.8)

## 2018-04-20 ENCOUNTER — Ambulatory Visit (INDEPENDENT_AMBULATORY_CARE_PROVIDER_SITE_OTHER): Payer: Commercial Managed Care - PPO | Admitting: Obstetrics and Gynecology

## 2018-04-20 ENCOUNTER — Encounter: Payer: Self-pay | Admitting: Obstetrics and Gynecology

## 2018-04-20 ENCOUNTER — Other Ambulatory Visit: Payer: Self-pay

## 2018-04-20 VITALS — BP 122/84 | HR 84 | Wt 275.6 lb

## 2018-04-20 DIAGNOSIS — R110 Nausea: Secondary | ICD-10-CM

## 2018-04-20 DIAGNOSIS — Z90710 Acquired absence of both cervix and uterus: Secondary | ICD-10-CM

## 2018-04-20 DIAGNOSIS — R3915 Urgency of urination: Secondary | ICD-10-CM

## 2018-04-20 DIAGNOSIS — R42 Dizziness and giddiness: Secondary | ICD-10-CM

## 2018-04-20 LAB — POCT URINALYSIS DIPSTICK
BILIRUBIN UA: NEGATIVE
Blood, UA: POSITIVE
Glucose, UA: NEGATIVE
Ketones, UA: NEGATIVE
Leukocytes, UA: NEGATIVE
Nitrite, UA: NEGATIVE
Protein, UA: POSITIVE — AB
Spec Grav, UA: 1.01 (ref 1.010–1.025)
UROBILINOGEN UA: 0.2 U/dL
pH, UA: 5 (ref 5.0–8.0)

## 2018-04-20 NOTE — Progress Notes (Signed)
GYNECOLOGY  VISIT   HPI: 37 y.o.   Married  [place] American  female   [phone] with Patient's last menstrual period was 03/31/2018 (exact date).   here for ~2 week post op check from a TLH/BS/extensive LOA. She has been feeling light headed intermittently since surgery, doesn't feel like she is going to pass out. It hasn't improved or worsened. Hgb on  7/24 was 9.3 gm/dl, Hgb pm 7/26 was 9.7 gm/dl. She has been taking iron, stopped recently secondary to constipation. She did have a BM yesterday.  For the last week she has been having some nausea in the am when she wakes up, some gagging, but no emesis. She is eating okay and feels okay the rest of the day. Voiding okay. Some urgency to void. She thinks she is empty.  Pain is well controlled, no pain meds since Saturday.   GYNECOLOGIC HISTORY: Patient's last menstrual period was 03/31/2018 (exact date). Contraception: Hysterectomy Menopausal hormone therapy: None        OB History    Gravida  3   Para  2   Term  2   Preterm      AB  1   Living  2     SAB      TAB  1   Ectopic      Multiple      Live Births                 Patient Active Problem List   Diagnosis Date Noted  . Status post laparoscopic hysterectomy 04/07/2018  . Family history of breast cancer   . Need for Tdap vaccination 08/06/2011  . Obesity 08/06/2011    Past Medical History:  Diagnosis Date  . Abnormal uterine bleeding   . Anemia   . Dysmenorrhea   . Fibroid   . Obesity   . SVD (spontaneous vaginal delivery) 2007   x x    Past Surgical History:  Procedure Laterality Date  . CESAREAN SECTION  2009   x 1  . CYSTOSCOPY N/A 04/07/2018   Procedure: CYSTOSCOPY;  Surgeon: Salvadore Dom, MD;  Location: [facility];  Service: Gynecology;  Laterality: N/A;  . LAPAROSCOPIC LYSIS OF ADHESIONS  04/07/2018   Procedure: EXTENSIVE LAPAROSCOPIC LYSIS OF ADHESIONS;  Surgeon: Salvadore Dom, MD;  Location: [facility];  Service: Gynecology;;  .  TOTAL LAPAROSCOPIC HYSTERECTOMY WITH SALPINGECTOMY Bilateral 04/07/2018   Procedure: TOTAL LAPAROSCOPIC HYSTERECTOMY WITH SALPINGECTOMY;  Surgeon: Salvadore Dom, MD;  Location: [facility];  Service: Gynecology;  Laterality: Bilateral;  3 hours OR time. BMI 51.21  . WISDOM TOOTH EXTRACTION      Current Outpatient Medications  Medication Sig Dispense Refill  . acetaminophen (TYLENOL) 325 MG tablet Take 2 tablets (650 mg total) by mouth every 4 (four) hours as needed for mild pain. 60 tablet 0  . docusate sodium (COLACE) 100 MG capsule Take 1 capsule (100 mg total) by mouth 2 (two) times daily. 30 capsule 0  . ferrous sulfate 325 (65 FE) MG tablet Take 325 mg by mouth at bedtime.    . traMADol (ULTRAM) 50 MG tablet 1-2 tablets po q 6 hours prn pain 30 tablet 0   No current facility-administered medications for this visit.      ALLERGIES: Patient has no known allergies.  Family History  Problem Relation Age of Onset  . Diabetes Mother   . Hypertension Mother   . Breast cancer Mother 58  . Breast cancer Maternal Grandmother  42  . Heart disease Neg Hx   . Stroke Neg Hx     Social History   Socioeconomic History  . Marital status: Married    Spouse name: Not on file  . Number of children: 2  . Years of education: Not on file  . Highest education level: Not on file  Occupational History  . Occupation: Air cabin crew: [employer]  . Financial resource strain: Not on file  . Food insecurity:    Worry: Not on file    Inability: Not on file  . Transportation needs:    Medical: Not on file    Non-medical: Not on file  Tobacco Use  . Smoking status: Never Smoker  . Smokeless tobacco: Never Used  Substance and Sexual Activity  . Alcohol use: No    Alcohol/week: 0.0 oz  . Drug use: No  . Sexual activity: Yes    Partners: Male    Birth control/protection: Surgical  Lifestyle  . Physical activity:    Days per week: Not on file    Minutes per  session: Not on file  . Stress: Not on file  Relationships  . Social connections:    Talks on phone: Not on file    Gets together: Not on file    Attends religious service: Not on file    Active member of club or organization: Not on file    Attends meetings of clubs or organizations: Not on file    Relationship status: Not on file  . Intimate partner violence:    Fear of current or ex partner: Not on file    Emotionally abused: Not on file    Physically abused: Not on file    Forced sexual activity: Not on file  Other Topics Concern  . Not on file  Social History Narrative   Married, children ages 66yo and 9yo, walks for exercise    Review of Systems  Constitutional: Negative.   HENT: Negative.   Eyes: Negative.   Respiratory: Negative.   Cardiovascular: Negative.   Gastrointestinal: Positive for constipation and nausea.  Genitourinary:       Pain with urination  Musculoskeletal: Negative.   Skin: Negative.   Neurological: Positive for dizziness.  Endo/Heme/Allergies: Negative.   Psychiatric/Behavioral: Negative.     PHYSICAL EXAMINATION:    BP 122/90 (BP Location: Right Arm, Patient Position: Sitting)   Pulse 96   Wt 275 lb 9.6 oz (125 kg)   LMP 03/31/2018 (Exact Date)   BMI 50.41 kg/m     General appearance: alert, cooperative and appears stated age Abdomen: soft, non-tender; non distended, no masses,  no organomegaly. Incisions: healing well, no erythema, induration or drainage.    ASSESSMENT 2 weeks s/p TLH/BS/LOA, overall doing okay, but having issues with continued dizziness, constipation and some nausea.  Urinary urgency Increased risk of breast cancer of 22.3%    PLAN Check orthostatics, BP is fine, pulse went up to 113 from sitting pulse of 96, but was fine when standing (84) CBC, CMP,  Hemoglobin currently 10.3 Recommended she drink lots of water, go slowly from sitting to standing, take her iron (can take senna if needed) If her lightheadedness  isn't improving she should call No driving if light headed Call with any concerns.  ccua for ua, c&s Set up breast MRI and mammogram    An After Visit Summary was printed and given to the patient.

## 2018-04-21 ENCOUNTER — Ambulatory Visit: Payer: Commercial Managed Care - PPO | Admitting: Obstetrics and Gynecology

## 2018-04-21 ENCOUNTER — Telehealth: Payer: Self-pay | Admitting: *Deleted

## 2018-04-21 ENCOUNTER — Other Ambulatory Visit: Payer: Self-pay | Admitting: Obstetrics and Gynecology

## 2018-04-21 DIAGNOSIS — Z9189 Other specified personal risk factors, not elsewhere classified: Secondary | ICD-10-CM

## 2018-04-21 DIAGNOSIS — Z1231 Encounter for screening mammogram for malignant neoplasm of breast: Secondary | ICD-10-CM

## 2018-04-21 LAB — URINALYSIS, MICROSCOPIC ONLY: Casts: NONE SEEN /lpf

## 2018-04-21 LAB — CBC
HEMOGLOBIN: 10.6 g/dL — AB (ref 11.1–15.9)
Hematocrit: 33.5 % — ABNORMAL LOW (ref 34.0–46.6)
MCH: 25.4 pg — ABNORMAL LOW (ref 26.6–33.0)
MCHC: 31.6 g/dL (ref 31.5–35.7)
MCV: 80 fL (ref 79–97)
PLATELETS: 502 10*3/uL — AB (ref 150–450)
RBC: 4.17 x10E6/uL (ref 3.77–5.28)
RDW: 14.7 % (ref 12.3–15.4)
WBC: 5.4 10*3/uL (ref 3.4–10.8)

## 2018-04-21 LAB — COMPREHENSIVE METABOLIC PANEL
ALBUMIN: 3.9 g/dL (ref 3.5–5.5)
ALK PHOS: 62 IU/L (ref 39–117)
ALT: 14 IU/L (ref 0–32)
AST: 13 IU/L (ref 0–40)
Albumin/Globulin Ratio: 1.1 — ABNORMAL LOW (ref 1.2–2.2)
BUN / CREAT RATIO: 9 (ref 9–23)
BUN: 6 mg/dL (ref 6–20)
Bilirubin Total: 0.3 mg/dL (ref 0.0–1.2)
CO2: 23 mmol/L (ref 20–29)
Calcium: 9.4 mg/dL (ref 8.7–10.2)
Chloride: 100 mmol/L (ref 96–106)
Creatinine, Ser: 0.67 mg/dL (ref 0.57–1.00)
GFR, EST AFRICAN AMERICAN: 130 mL/min/{1.73_m2} (ref >59)
GFR, EST NON AFRICAN AMERICAN: 113 mL/min/{1.73_m2} (ref >59)
GLUCOSE: 89 mg/dL (ref 65–99)
Globulin, Total: 3.5 g/dL (ref 1.5–4.5)
Potassium: 4.5 mmol/L (ref 3.5–5.2)
SODIUM: 138 mmol/L (ref 134–144)
Total Protein: 7.4 g/dL (ref 6.0–8.5)

## 2018-04-21 NOTE — Telephone Encounter (Addendum)
Spoke with [place] at [employer]. Was advised last MMG 01/24/16: negative,  MMG recommended at age 37. Advise patient MMG may not be covered by insurance. 3D MMG recommended, Tomo charge of $130 if not covered by insurance.   Advised yearly MMG and MRI recommended due to elevated lifetime risk of breast cancer.   Screening MMG scheduled for  05/14/18 arriving at 1:40pm for 2pm appt.    Order placed for bilateral breast MRI with and without contrast.

## 2018-04-21 NOTE — Telephone Encounter (Signed)
Call  to patient, left detailed message, ok  advised of appointment details as seen below. Advised our office will precert breast MRI and [facility] will call directly to schedule. Return call to office if any additional questions.   Routing to provider for final review. Patient is agreeable to disposition. Will close encounter.   Cc: Lerry Liner

## 2018-04-21 NOTE — Telephone Encounter (Signed)
-----   Message from Salvadore Dom, MD sent at 04/20/2018  4:37 PM EDT ----- This patient has a 22.3% risk of breast cancer. Please set her up for screening mammogram and breast MRI. Thanks, Sharee Pimple

## 2018-04-22 ENCOUNTER — Encounter (HOSPITAL_COMMUNITY): Payer: Self-pay

## 2018-04-22 ENCOUNTER — Inpatient Hospital Stay (HOSPITAL_COMMUNITY)
Admission: AD | Admit: 2018-04-22 | Discharge: 2018-04-23 | Disposition: A | Discharge status: Home / Self Care | Payer: Commercial Managed Care - PPO | Source: Ambulatory Visit | Attending: Obstetrics and Gynecology | Admitting: Obstetrics and Gynecology

## 2018-04-22 DIAGNOSIS — R102 Pelvic and perineal pain: Secondary | ICD-10-CM | POA: Diagnosis not present

## 2018-04-22 DIAGNOSIS — Z833 Family history of diabetes mellitus: Secondary | ICD-10-CM | POA: Insufficient documentation

## 2018-04-22 DIAGNOSIS — Z6841 Body Mass Index (BMI) 40.0 and over, adult: Secondary | ICD-10-CM | POA: Insufficient documentation

## 2018-04-22 DIAGNOSIS — E669 Obesity, unspecified: Secondary | ICD-10-CM | POA: Insufficient documentation

## 2018-04-22 DIAGNOSIS — N99820 Postprocedural hemorrhage and hematoma of a genitourinary system organ or structure following a genitourinary system procedure: Secondary | ICD-10-CM

## 2018-04-22 DIAGNOSIS — N939 Abnormal uterine and vaginal bleeding, unspecified: Secondary | ICD-10-CM | POA: Diagnosis present

## 2018-04-22 DIAGNOSIS — Z8249 Family history of ischemic heart disease and other diseases of the circulatory system: Secondary | ICD-10-CM | POA: Diagnosis not present

## 2018-04-22 DIAGNOSIS — Z90710 Acquired absence of both cervix and uterus: Secondary | ICD-10-CM | POA: Diagnosis not present

## 2018-04-22 DIAGNOSIS — Z803 Family history of malignant neoplasm of breast: Secondary | ICD-10-CM | POA: Diagnosis not present

## 2018-04-22 DIAGNOSIS — Z79899 Other long term (current) drug therapy: Secondary | ICD-10-CM | POA: Diagnosis not present

## 2018-04-22 LAB — CBC
HCT: 33.1 % — ABNORMAL LOW (ref 36.0–46.0)
Hemoglobin: 10.9 g/dL — ABNORMAL LOW (ref 12.0–15.0)
MCH: 26.3 pg (ref 26.0–34.0)
MCHC: 32.9 g/dL (ref 30.0–36.0)
MCV: 80 fL (ref 78.0–100.0)
PLATELETS: 453 10*3/uL — AB (ref 150–400)
RBC: 4.14 MIL/uL (ref 3.87–5.11)
RDW: 13.3 % (ref 11.5–15.5)
WBC: 5.7 10*3/uL (ref 4.0–10.5)

## 2018-04-22 LAB — URINE CULTURE

## 2018-04-22 NOTE — MAU Note (Signed)
Pt states she had a lap hysterectomy on 04/07/2018. States she bled x2 days and then the bleeding stopped. States tonight she got out of the shower and was having bleeding. States the area of blood was larger than a dollar bill on her towel. Pt reports some mild lower abdominal cramping that also started today. Pt states she has not taken anything today for pain. Rates 4/10.

## 2018-04-23 ENCOUNTER — Encounter: Payer: Self-pay | Admitting: Obstetrics and Gynecology

## 2018-04-23 ENCOUNTER — Telehealth: Payer: Self-pay | Admitting: Obstetrics and Gynecology

## 2018-04-23 ENCOUNTER — Other Ambulatory Visit: Payer: Self-pay

## 2018-04-23 ENCOUNTER — Ambulatory Visit (INDEPENDENT_AMBULATORY_CARE_PROVIDER_SITE_OTHER): Payer: Commercial Managed Care - PPO | Admitting: Obstetrics and Gynecology

## 2018-04-23 VITALS — BP 124/78 | HR 76 | Wt 278.2 lb

## 2018-04-23 DIAGNOSIS — R1031 Right lower quadrant pain: Secondary | ICD-10-CM

## 2018-04-23 DIAGNOSIS — Z90710 Acquired absence of both cervix and uterus: Secondary | ICD-10-CM

## 2018-04-23 DIAGNOSIS — N99820 Postprocedural hemorrhage and hematoma of a genitourinary system organ or structure following a genitourinary system procedure: Secondary | ICD-10-CM

## 2018-04-23 DIAGNOSIS — N920 Excessive and frequent menstruation with regular cycle: Secondary | ICD-10-CM

## 2018-04-23 NOTE — Progress Notes (Signed)
GYNECOLOGY  VISIT   HPI: 37 y.o.   Married  Caucasian  female   [phone] with Patient's last menstrual period was 03/31/2018 (exact date).   She is 2+ weeks s/p TLH, here for recheck for post op bleeding after laparoscopic hysterectomy. Reports light vaginal spotting today with abdominal cramping. She has had some new cramping in her lower right abdomen, 4-5/10 in severity, constant. It started yesterday. She was more active starting yesterday. She was seen in the MAU last night with c/o light bleeding, now just brown spotting. Cuff was intact, slightly friable.  No fevers, nausea is better. BM last night, normal. Voiding okay. WBC was normal at 5.7 and hgb up to 10.9 last night.   GYNECOLOGIC HISTORY: Patient's last menstrual period was 03/31/2018 (exact date). Contraception:Hysterectomy Menopausal hormone therapy: None        OB History    Gravida  3   Para  2   Term  2   Preterm  0   AB  1   Living  2     SAB      TAB  1   Ectopic      Multiple      Live Births  2              Patient Active Problem List   Diagnosis Date Noted  . Status post laparoscopic hysterectomy 04/07/2018  . Family history of breast cancer   . Need for Tdap vaccination 08/06/2011  . Obesity 08/06/2011    Past Medical History:  Diagnosis Date  . Abnormal uterine bleeding   . Anemia   . Dysmenorrhea   . Fibroid   . Obesity   . SVD (spontaneous vaginal delivery) 2007   x x    Past Surgical History:  Procedure Laterality Date  . ABDOMINAL HYSTERECTOMY    . CESAREAN SECTION  2009   x 1  . CYSTOSCOPY N/A 04/07/2018   Procedure: CYSTOSCOPY;  Surgeon: Salvadore Dom, MD;  Location: [facility];  Service: Gynecology;  Laterality: N/A;  . LAPAROSCOPIC LYSIS OF ADHESIONS  04/07/2018   Procedure: EXTENSIVE LAPAROSCOPIC LYSIS OF ADHESIONS;  Surgeon: Salvadore Dom, MD;  Location: [facility];  Service: Gynecology;;  . TOTAL LAPAROSCOPIC HYSTERECTOMY WITH SALPINGECTOMY Bilateral  04/07/2018   Procedure: TOTAL LAPAROSCOPIC HYSTERECTOMY WITH SALPINGECTOMY;  Surgeon: Salvadore Dom, MD;  Location: [facility];  Service: Gynecology;  Laterality: Bilateral;  3 hours OR time. BMI 51.21  . WISDOM TOOTH EXTRACTION      Current Outpatient Medications  Medication Sig Dispense Refill  . acetaminophen (TYLENOL) 325 MG tablet Take 2 tablets (650 mg total) by mouth every 4 (four) hours as needed for mild pain. 60 tablet 0  . docusate sodium (COLACE) 100 MG capsule Take 1 capsule (100 mg total) by mouth 2 (two) times daily. 30 capsule 0  . ferrous sulfate 325 (65 FE) MG tablet Take 325 mg by mouth at bedtime.    . traMADol (ULTRAM) 50 MG tablet 1-2 tablets po q 6 hours prn pain 30 tablet 0   No current facility-administered medications for this visit.      ALLERGIES: Patient has no known allergies.  Family History  Problem Relation Age of Onset  . Diabetes Mother   . Hypertension Mother   . Breast cancer Mother 37  . Breast cancer Maternal Grandmother 45  . Heart disease Neg Hx   . Stroke Neg Hx     Social History   Socioeconomic History  .  Marital status: Married    Spouse name: Not on file  . Number of children: 2  . Years of education: Not on file  . Highest education level: Not on file  Occupational History  . Occupation: Air cabin crew: AT [employer]  . Financial resource strain: Not on file  . Food insecurity:    Worry: Not on file    Inability: Not on file  . Transportation needs:    Medical: Not on file    Non-medical: Not on file  Tobacco Use  . Smoking status: Never Smoker  . Smokeless tobacco: Never Used  Substance and Sexual Activity  . Alcohol use: No    Alcohol/week: 0.0 standard drinks  . Drug use: No  . Sexual activity: Yes    Partners: Male    Birth control/protection: Surgical    Comment: Hysterectomy  Lifestyle  . Physical activity:    Days per week: Not on file    Minutes per session: Not on file  .  Stress: Not on file  Relationships  . Social connections:    Talks on phone: Not on file    Gets together: Not on file    Attends religious service: Not on file    Active member of club or organization: Not on file    Attends meetings of clubs or organizations: Not on file    Relationship status: Not on file  . Intimate partner violence:    Fear of current or ex partner: Not on file    Emotionally abused: Not on file    Physically abused: Not on file    Forced sexual activity: Not on file  Other Topics Concern  . Not on file  Social History Narrative   Married, children ages 27yo and 13yo, walks for exercise    Review of Systems  Constitutional: Negative.   HENT: Negative.   Eyes: Negative.   Respiratory: Negative.   Gastrointestinal:       Abdominal cramping  Genitourinary:       Light vaginal spotting  Musculoskeletal: Negative.   Skin: Negative.   Neurological: Negative.   Endo/Heme/Allergies: Negative.   Psychiatric/Behavioral: Negative.     PHYSICAL EXAMINATION:    BP 124/78 (BP Location: Right Arm, Patient Position: Sitting)   Pulse 76   Wt 278 lb 3.2 oz (126.2 kg)   LMP 03/31/2018 (Exact Date)   BMI 50.88 kg/m     General appearance: alert, cooperative and appears stated age Abdomen: soft, non-tender; non distended, no masses,  no organomegaly Incisions: well healed  Pelvic: External genitalia:  no lesions              Urethra:  normal appearing urethra with no masses, tenderness or lesions              Bartholins and Skenes: normal                 Vagina: normal appearing vagina with normal color and discharge, no lesions  Vaginal cuff: intact, stitches seen (closed vaginally), small area of friable tissue treated with silver nitrate. Cuff is tender to palpation, no masses.               Cervix: absent                 Adnexa: no mass, fullness, tenderness               Chaperone was present for exam.  ASSESSMENT 2+ weeks s/p TLH, some cramping and  light bleeding yesterday after increased activity. Still cramping this morning, hasn't taken any pain meds. Cuff is intact, small friable area treated with silver nitrate. Abdominal exam is benign, cuff is tender, not erythematous, no masses, normal WBC    PLAN She will take some pain meds, call with any concerns.  If her pain worsens, will check CT (will hold for now with completely benign abdominal exam and normal labs)   An After Visit Summary was printed and given to the patient.

## 2018-04-23 NOTE — Discharge Instructions (Signed)
Laparoscopically Assisted Vaginal Hysterectomy, Care After °Refer to this sheet in the next few weeks. These instructions provide you with information on caring for yourself after your procedure. Your health care provider may also give you more specific instructions. Your treatment has been planned according to current medical practices, but problems sometimes occur. Call your health care provider if you have any problems or questions after your procedure. °What can I expect after the procedure? °After your procedure, it is typical to have the following: °· Abdominal pain. You will be given pain medicine to control it. °· Sore throat from the breathing tube that was inserted during surgery. ° °Follow these instructions at home: °· Only take over-the-counter or prescription medicines for pain, discomfort, or fever as directed by your health care provider. °· Do not take aspirin. It can cause bleeding. °· Do not drive when taking pain medicine. °· Follow your health care provider's advice regarding diet, exercise, lifting, driving, and general activities. °· Resume your usual diet as directed and allowed. °· Get plenty of rest and sleep. °· Do not douche, use tampons, or have sexual intercourse for at least 6 weeks, or until your health care provider gives you permission. °· Change your bandages (dressings) as directed by your health care provider. °· Monitor your temperature and notify your health care provider of a fever. °· Take showers instead of baths for 2-3 weeks. °· Do not drink alcohol until your health care provider gives you permission. °· If you develop constipation, you may take a mild laxative with your health care provider's permission. Bran foods may help with constipation problems. Drinking enough fluids to keep your urine clear or pale yellow may help as well. °· Try to have someone home with you for 1-2 weeks to help around the house. °· Keep all of your follow-up appointments as directed by your  health care provider. °Contact a health care provider if: °· You have swelling, redness, or increasing pain around your incision sites. °· You have pus coming from your incision. °· You notice a bad smell coming from your incision. °· Your incision breaks open. °· You feel dizzy or lightheaded. °· You have pain or bleeding when you urinate. °· You have persistent diarrhea. °· You have persistent nausea and vomiting. °· You have abnormal vaginal discharge. °· You have a rash. °· You have any type of abnormal reaction or develop an allergy to your medicine. °· You have poor pain control with your prescribed medicine. °Get help right away if: °· You have a fever. °· You have severe abdominal pain. °· You have chest pain. °· You have shortness of breath. °· You faint. °· You have pain, swelling, or redness in your leg. °· You have heavy vaginal bleeding with blood clots. °This information is not intended to replace advice given to you by your health care provider. Make sure you discuss any questions you have with your health care provider. °Document Released: 08/22/2011 Document Revised: 02/08/2016 Document Reviewed: 03/18/2013 °Elsevier Interactive Patient Education © 2017 Elsevier Inc. ° °

## 2018-04-23 NOTE — Telephone Encounter (Signed)
Pt returned call and is scheduled for office visit with Dr. Talbert Nan this morning at 1015.

## 2018-04-23 NOTE — Telephone Encounter (Signed)
Call to patient. Dr Quincy Simmonds advised of ED visit and instructed call to patient.  Left message to call back to triage nurse.

## 2018-04-23 NOTE — Telephone Encounter (Signed)
Left detailed message for patient to return call for scheduling. Advised if she is continuing to bleed will need to be seen this morning. (Per Dr.Jertson if she is bleeding heavily work in this AM. If spotting okay to be seen this afternoon at the end of the day.)

## 2018-04-23 NOTE — MAU Provider Note (Signed)
Chief Complaint:  Post-op Problem and Vaginal Bleeding   First Provider Initiated Contact with Patient 04/23/18 0000       HPI: [name] is a 37 y.o. [id number] who presents to maternity admissions reporting vaginal bleeding tonight.  Also has had some lower abdominal cramping today.  Had a Lap Hyst on 04/07/18. She reports vaginal bleeding, vaginal itching/burning, urinary symptoms, h/a, dizziness, n/v, or fever/chills.    Vaginal Bleeding  The patient's primary symptoms include pelvic pain and vaginal bleeding. The patient's pertinent negatives include no genital itching, genital lesions or genital odor. This is a new problem. The current episode started today. The problem occurs intermittently. The problem has been resolved. The pain is mild. She is not pregnant. Associated symptoms include abdominal pain. Pertinent negatives include no chills, constipation, diarrhea, fever, nausea or vomiting. The vaginal discharge was bloody. She has been passing clots. She has not been passing tissue. Nothing aggravates the symptoms. She has tried nothing for the symptoms. Her past medical history is significant for an abdominal surgery and a gynecological surgery.   RN Note: Pt states she had a lap hysterectomy on 04/07/2018. States she bled x2 days and then the bleeding stopped. States tonight she got out of the shower and was having bleeding. States the area of blood was larger than a dollar bill on her towel. Pt reports some mild lower abdominal cramping that also started today. Pt states she has not taken anything today for pain. Rates 4/10.   Past Medical History: Past Medical History:  Diagnosis Date  . Abnormal uterine bleeding   . Anemia   . Dysmenorrhea   . Fibroid   . Obesity   . SVD (spontaneous vaginal delivery) 2007   x x    Past obstetric history: OB History  Gravida Para Term Preterm AB Living  3 2 2   1 2   SAB TAB Ectopic Multiple Live Births    1          # Outcome Date GA  Lbr Len/2nd Weight Sex Delivery Anes PTL Lv  3 TAB           2 Term           1 Term             Past Surgical History: Past Surgical History:  Procedure Laterality Date  . ABDOMINAL HYSTERECTOMY    . CESAREAN SECTION  2009   x 1  . CYSTOSCOPY N/A 04/07/2018   Procedure: CYSTOSCOPY;  Surgeon: Salvadore Dom, MD;  Location: [facility];  Service: Gynecology;  Laterality: N/A;  . LAPAROSCOPIC LYSIS OF ADHESIONS  04/07/2018   Procedure: EXTENSIVE LAPAROSCOPIC LYSIS OF ADHESIONS;  Surgeon: Salvadore Dom, MD;  Location: [facility];  Service: Gynecology;;  . TOTAL LAPAROSCOPIC HYSTERECTOMY WITH SALPINGECTOMY Bilateral 04/07/2018   Procedure: TOTAL LAPAROSCOPIC HYSTERECTOMY WITH SALPINGECTOMY;  Surgeon: Salvadore Dom, MD;  Location: [facility];  Service: Gynecology;  Laterality: Bilateral;  3 hours OR time. BMI 51.21  . WISDOM TOOTH EXTRACTION      Family History: Family History  Problem Relation Age of Onset  . Diabetes Mother   . Hypertension Mother   . Breast cancer Mother 23  . Breast cancer Maternal Grandmother 63  . Heart disease Neg Hx   . Stroke Neg Hx     Social History: Social History   Tobacco Use  . Smoking status: Never Smoker  . Smokeless tobacco: Never Used  Substance Use Topics  .  Alcohol use: No    Alcohol/week: 0.0 oz  . Drug use: No    Allergies: No Known Allergies  Meds:  Medications Prior to Admission  Medication Sig Dispense Refill Last Dose  . docusate sodium (COLACE) 100 MG capsule Take 1 capsule (100 mg total) by mouth 2 (two) times daily. 30 capsule 0 04/22/2018 at Unknown time  . acetaminophen (TYLENOL) 325 MG tablet Take 2 tablets (650 mg total) by mouth every 4 (four) hours as needed for mild pain. 60 tablet 0 Taking  . ferrous sulfate 325 (65 FE) MG tablet Take 325 mg by mouth at bedtime.   Taking  . traMADol (ULTRAM) 50 MG tablet 1-2 tablets po q 6 hours prn pain 30 tablet 0 Taking    I have reviewed patient's Past Medical Hx, Surgical  Hx, Family Hx, Social Hx, medications and allergies.  ROS:  Review of Systems  Constitutional: Negative for chills and fever.  Gastrointestinal: Positive for abdominal pain. Negative for constipation, diarrhea, nausea and vomiting.  Genitourinary: Positive for pelvic pain and vaginal bleeding.   Other systems negative     Physical Exam   Patient Vitals for the past 24 hrs:  BP Temp Pulse Resp SpO2 Height Weight  04/22/18 2325 (!) 142/85 98.1 F (36.7 C) 82 16 100 % 5\' 2"  (1.575 m) 279 lb (126.6 kg)   Constitutional: Well-developed, well-nourished female in no acute distress.  Cardiovascular: normal rate and rhythm, no ectopy audible, S1 & S2 heard, no murmur Respiratory: normal effort, no distress. Lungs CTAB with no wheezes or crackles GI: Abd soft, non-tender.  Nondistended.  No rebound, No guarding.  Bowel Sounds audible  MS: Extremities nontender, no edema, normal ROM Neurologic: Alert and oriented x 4.   Grossly nonfocal. GU: Neg CVAT. Skin:  Warm and Dry Psych:  Affect appropriate.  PELVIC EXAM: Speculum exam done.  Cervical cuff visualized, sutures seen. There is a small area of friability noted near sutures.  No active bleeding but oozes when touched with swab  No dehiscence seen   Labs: Results for orders placed or performed during the hospital encounter of 04/22/18 (from the past 24 hour(s))  CBC     Status: Abnormal   Collection Time: 04/22/18 11:40 PM  Result Value Ref Range   WBC 5.7 4.0 - 10.5 K/uL   RBC 4.14 3.87 - 5.11 MIL/uL   Hemoglobin 10.9 (L) 12.0 - 15.0 g/dL   HCT 33.1 (L) 36.0 - 46.0 %   MCV 80.0 78.0 - 100.0 fL   MCH 26.3 26.0 - 34.0 pg   MCHC 32.9 30.0 - 36.0 g/dL   RDW 13.3 11.5 - 15.5 %   Platelets 453 (H) 150 - 400 K/uL      Imaging:  No results found.  MAU Course/MDM: I have ordered labs as follows:  CBC,  Hemoglobin is stable Imaging ordered: none Results reviewed.   Consult Dr Quincy Simmonds who recommends followup in office tomorrow.    Treatments in MAU included exam.   Pt stable at time of discharge.  Assessment: Cervical cuff friability and oozing Recent Lap Hysterectomy  Plan: Discharge home Recommend pelvic rest, followup in am in office  Bleeding precautions  Encouraged to return here or to other Urgent Care/ED if she develops worsening of symptoms, increase in pain, fever, or other concerning symptoms.   Hansel Feinstein CNM, MSN Certified Nurse-Midwife 04/23/2018 12:00 AM

## 2018-04-23 NOTE — Telephone Encounter (Signed)
Patient was seen in the ER last night and was told to follow up with Dr.Jertson today.

## 2018-04-28 NOTE — Progress Notes (Signed)
GYNECOLOGY  VISIT   HPI: 37 y.o.   Married  [place] American  female   [phone] with Patient's last menstrual period was 03/31/2018 (exact date).   here for post op follow up, TOTAL LAPAROSCOPIC HYSTERECTOMY WITH SALPINGECTOMY (Bilateral ) CYSTOSCOPY (N/A ) EXTENSIVE LAPAROSCOPIC LYSIS OF ADHESIONS 04/07/2018.    She is 3 weeks s/p TLH. She c/o continued lower abdominal cramping. Intermittent, but can last for hours at a time. Up to a 6-7/10 in severity. Discomfort worsened as she increased her activity. She is taking tylenol. Took last tramadol last night.  No bleeding. Normal BM, voiding okay. No fevers  GYNECOLOGIC HISTORY: Patient's last menstrual period was 03/31/2018 (exact date). Contraception:hysterectomy Menopausal hormone therapy: none        OB History    Gravida  3   Para  2   Term  2   Preterm  0   AB  1   Living  2     SAB      TAB  1   Ectopic      Multiple      Live Births  2              Patient Active Problem List   Diagnosis Date Noted  . Status post laparoscopic hysterectomy 04/07/2018  . Family history of breast cancer   . Need for Tdap vaccination 08/06/2011  . Obesity 08/06/2011    Past Medical History:  Diagnosis Date  . Abnormal uterine bleeding   . Anemia   . Dysmenorrhea   . Fibroid   . Obesity   . SVD (spontaneous vaginal delivery) 2007   x x    Past Surgical History:  Procedure Laterality Date  . ABDOMINAL HYSTERECTOMY    . CESAREAN SECTION  2009   x 1  . CYSTOSCOPY N/A 04/07/2018   Procedure: CYSTOSCOPY;  Surgeon: Salvadore Dom, MD;  Location: [facility];  Service: Gynecology;  Laterality: N/A;  . LAPAROSCOPIC LYSIS OF ADHESIONS  04/07/2018   Procedure: EXTENSIVE LAPAROSCOPIC LYSIS OF ADHESIONS;  Surgeon: Salvadore Dom, MD;  Location: [facility];  Service: Gynecology;;  . TOTAL LAPAROSCOPIC HYSTERECTOMY WITH SALPINGECTOMY Bilateral 04/07/2018   Procedure: TOTAL LAPAROSCOPIC HYSTERECTOMY WITH SALPINGECTOMY;   Surgeon: Salvadore Dom, MD;  Location: [facility];  Service: Gynecology;  Laterality: Bilateral;  3 hours OR time. BMI 51.21  . WISDOM TOOTH EXTRACTION      Current Outpatient Medications  Medication Sig Dispense Refill  . acetaminophen (TYLENOL) 325 MG tablet Take 2 tablets (650 mg total) by mouth every 4 (four) hours as needed for mild pain. 60 tablet 0  . docusate sodium (COLACE) 100 MG capsule Take 1 capsule (100 mg total) by mouth 2 (two) times daily. 30 capsule 0  . ferrous sulfate 325 (65 FE) MG tablet Take 325 mg by mouth at bedtime.    . traMADol (ULTRAM) 50 MG tablet 1-2 tablets po q 6 hours prn pain 30 tablet 0   No current facility-administered medications for this visit.      ALLERGIES: Patient has no known allergies.  Family History  Problem Relation Age of Onset  . Diabetes Mother   . Hypertension Mother   . Breast cancer Mother 86  . Breast cancer Maternal Grandmother 39  . Heart disease Neg Hx   . Stroke Neg Hx     Social History   Socioeconomic History  . Marital status: Married    Spouse name: Not on file  . Number of  children: 2  . Years of education: Not on file  . Highest education level: Not on file  Occupational History  . Occupation: Air cabin crew: [employer]  . Financial resource strain: Not on file  . Food insecurity:    Worry: Not on file    Inability: Not on file  . Transportation needs:    Medical: Not on file    Non-medical: Not on file  Tobacco Use  . Smoking status: Never Smoker  . Smokeless tobacco: Never Used  Substance and Sexual Activity  . Alcohol use: No    Alcohol/week: 0.0 standard drinks  . Drug use: No  . Sexual activity: Yes    Partners: Male    Birth control/protection: Surgical    Comment: Hysterectomy  Lifestyle  . Physical activity:    Days per week: Not on file    Minutes per session: Not on file  . Stress: Not on file  Relationships  . Social connections:    Talks on phone:  Not on file    Gets together: Not on file    Attends religious service: Not on file    Active member of club or organization: Not on file    Attends meetings of clubs or organizations: Not on file    Relationship status: Not on file  . Intimate partner violence:    Fear of current or ex partner: Not on file    Emotionally abused: Not on file    Physically abused: Not on file    Forced sexual activity: Not on file  Other Topics Concern  . Not on file  Social History Narrative   Married, children ages 4yo and 27yo, walks for exercise    Review of Systems  Constitutional: Negative.   HENT: Negative.   Eyes: Negative.   Respiratory: Negative.   Cardiovascular: Negative.   Gastrointestinal: Negative.   Genitourinary: Negative.   Musculoskeletal: Negative.   Skin: Negative.   Neurological: Negative.   Endo/Heme/Allergies: Negative.   Psychiatric/Behavioral: Negative.   All other systems reviewed and are negative.   PHYSICAL EXAMINATION:    LMP 03/31/2018 (Exact Date)     General appearance: alert, cooperative and appears stated age Abdomen: soft, tender low in the RLQ; no rebound, no guarding, non distended, no masses,  no organomegaly  Pelvic: External genitalia:  no lesions              Urethra:  normal appearing urethra with no masses, tenderness or lesions              Bartholins and Skenes: normal                 Vagina: normal appearing vagina with an increase in yellow, frothy vaginal d/c. No vaginal erythema   Vaginal cuff is healing well, tender in areas of the cuff, more tender on the right side of the cuff.               Cervix: absent              Bimanual Exam:  Uterus:  uterus absent              Adnexa: no masses, tender in the right adnexa                Chaperone was present for exam.  Wet prep: + clue, no trich, + wbc KOH: no yeast PH: 5   ASSESSMENT Post op  pain, not new BV    PLAN Flagyl for bv Take ibuprofen with the tylenol If her pain  isn't improving by Monday will order CT scan Otherwise f/u in about one week.  Recommend she stay out of work for now.    An After Visit Summary was printed and given to the patient.

## 2018-04-29 NOTE — Progress Notes (Deleted)
GYNECOLOGY  VISIT   HPI: 37 y.o.   Married  [place] American  female   [phone] with Patient's last menstrual period was 03/31/2018 (exact date).   here for post op.    GYNECOLOGIC HISTORY: Patient's last menstrual period was 03/31/2018 (exact date). Contraception:Hysterectomy Menopausal hormone therapy: none        OB History    Gravida  3   Para  2   Term  2   Preterm  0   AB  1   Living  2     SAB      TAB  1   Ectopic      Multiple      Live Births  2              Patient Active Problem List   Diagnosis Date Noted  . Status post laparoscopic hysterectomy 04/07/2018  . Family history of breast cancer   . Need for Tdap vaccination 08/06/2011  . Obesity 08/06/2011    Past Medical History:  Diagnosis Date  . Abnormal uterine bleeding   . Anemia   . Dysmenorrhea   . Fibroid   . Obesity   . SVD (spontaneous vaginal delivery) 2007   x x    Past Surgical History:  Procedure Laterality Date  . ABDOMINAL HYSTERECTOMY    . CESAREAN SECTION  2009   x 1  . CYSTOSCOPY N/A 04/07/2018   Procedure: CYSTOSCOPY;  Surgeon: Salvadore Dom, MD;  Location: [facility];  Service: Gynecology;  Laterality: N/A;  . LAPAROSCOPIC LYSIS OF ADHESIONS  04/07/2018   Procedure: EXTENSIVE LAPAROSCOPIC LYSIS OF ADHESIONS;  Surgeon: Salvadore Dom, MD;  Location: [facility];  Service: Gynecology;;  . TOTAL LAPAROSCOPIC HYSTERECTOMY WITH SALPINGECTOMY Bilateral 04/07/2018   Procedure: TOTAL LAPAROSCOPIC HYSTERECTOMY WITH SALPINGECTOMY;  Surgeon: Salvadore Dom, MD;  Location: [facility];  Service: Gynecology;  Laterality: Bilateral;  3 hours OR time. BMI 51.21  . WISDOM TOOTH EXTRACTION      Current Outpatient Medications  Medication Sig Dispense Refill  . acetaminophen (TYLENOL) 325 MG tablet Take 2 tablets (650 mg total) by mouth every 4 (four) hours as needed for mild pain. 60 tablet 0  . docusate sodium (COLACE) 100 MG capsule Take 1 capsule (100 mg total) by mouth 2  (two) times daily. 30 capsule 0  . ferrous sulfate 325 (65 FE) MG tablet Take 325 mg by mouth at bedtime.    . traMADol (ULTRAM) 50 MG tablet 1-2 tablets po q 6 hours prn pain 30 tablet 0   No current facility-administered medications for this visit.      ALLERGIES: Patient has no known allergies.  Family History  Problem Relation Age of Onset  . Diabetes Mother   . Hypertension Mother   . Breast cancer Mother 84  . Breast cancer Maternal Grandmother 24  . Heart disease Neg Hx   . Stroke Neg Hx     Social History   Socioeconomic History  . Marital status: Married    Spouse name: Not on file  . Number of children: 2  . Years of education: Not on file  . Highest education level: Not on file  Occupational History  . Occupation: Air cabin crew: [employer]  . Financial resource strain: Not on file  . Food insecurity:    Worry: Not on file    Inability: Not on file  . Transportation needs:    Medical:  Not on file    Non-medical: Not on file  Tobacco Use  . Smoking status: Never Smoker  . Smokeless tobacco: Never Used  Substance and Sexual Activity  . Alcohol use: No    Alcohol/week: 0.0 standard drinks  . Drug use: No  . Sexual activity: Yes    Partners: Male    Birth control/protection: Surgical    Comment: Hysterectomy  Lifestyle  . Physical activity:    Days per week: Not on file    Minutes per session: Not on file  . Stress: Not on file  Relationships  . Social connections:    Talks on phone: Not on file    Gets together: Not on file    Attends religious service: Not on file    Active member of club or organization: Not on file    Attends meetings of clubs or organizations: Not on file    Relationship status: Not on file  . Intimate partner violence:    Fear of current or ex partner: Not on file    Emotionally abused: Not on file    Physically abused: Not on file    Forced sexual activity: Not on file  Other Topics Concern   . Not on file  Social History Narrative   Married, children ages 64yo and [place], walks for exercise    ROS  PHYSICAL EXAMINATION:    LMP 03/31/2018 (Exact Date)     General appearance: alert, cooperative and appears stated age Neck: no adenopathy, supple, symmetrical, trachea midline and thyroid {CHL AMB PHY EX THYROID NORM DEFAULT:[phone]::"normal to inspection and palpation"} Breasts: {Exam; breast:13139::"normal appearance, no masses or tenderness"} Abdomen: soft, non-tender; non distended, no masses,  no organomegaly  Pelvic: External genitalia:  no lesions              Urethra:  normal appearing urethra with no masses, tenderness or lesions              Bartholins and Skenes: normal                 Vagina: normal appearing vagina with normal color and discharge, no lesions              Cervix: {CHL AMB PHY EX CERVIX NORM DEFAULT:[phone]::"no lesions"}              Bimanual Exam:  Uterus:  {CHL AMB PHY EX UTERUS NORM DEFAULT:[phone]::"normal size, contour, position, consistency, mobility, non-tender"}              Adnexa: {CHL AMB PHY EX ADNEXA NO MASS DEFAULT:[phone]::"no mass, fullness, tenderness"}              Rectovaginal: {yes no:[id number]}.  Confirms.              Anus:  normal sphincter tone, no lesions  Chaperone was present for exam.  ASSESSMENT     PLAN    An After Visit Summary was printed and given to the patient.  *** minutes face to face time of which over 50% was spent in counseling.

## 2018-05-01 ENCOUNTER — Encounter: Payer: Self-pay | Admitting: Obstetrics and Gynecology

## 2018-05-01 ENCOUNTER — Other Ambulatory Visit: Payer: Self-pay

## 2018-05-01 ENCOUNTER — Ambulatory Visit (INDEPENDENT_AMBULATORY_CARE_PROVIDER_SITE_OTHER): Payer: Commercial Managed Care - PPO | Admitting: Obstetrics and Gynecology

## 2018-05-01 VITALS — BP 138/83 | HR 89 | Resp 18 | Ht 62.0 in | Wt 280.0 lb

## 2018-05-01 DIAGNOSIS — B9689 Other specified bacterial agents as the cause of diseases classified elsewhere: Secondary | ICD-10-CM | POA: Diagnosis not present

## 2018-05-01 DIAGNOSIS — N760 Acute vaginitis: Secondary | ICD-10-CM | POA: Diagnosis not present

## 2018-05-01 DIAGNOSIS — G8918 Other acute postprocedural pain: Secondary | ICD-10-CM

## 2018-05-01 DIAGNOSIS — Z90710 Acquired absence of both cervix and uterus: Secondary | ICD-10-CM

## 2018-05-01 MED ORDER — METRONIDAZOLE 500 MG PO TABS: 500.0000 mg | ORAL_TABLET | Freq: Two times a day (BID) | ORAL | 0 refills | Status: DC

## 2018-05-01 MED ORDER — IBUPROFEN 800 MG PO TABS: 800.0000 mg | ORAL_TABLET | Freq: Three times a day (TID) | ORAL | 1 refills | Status: DC | PRN

## 2018-05-01 NOTE — Patient Instructions (Signed)
Bacterial Vaginosis Bacterial vaginosis is a vaginal infection that occurs when the normal balance of bacteria in the vagina is disrupted. It results from an overgrowth of certain bacteria. This is the most common vaginal infection among women ages 15-44. Because bacterial vaginosis increases your risk for STIs (sexually transmitted infections), getting treated can help reduce your risk for chlamydia, gonorrhea, herpes, and HIV (human immunodeficiency virus). Treatment is also important for preventing complications in pregnant women, because this condition can cause an early (premature) delivery. What are the causes? This condition is caused by an increase in harmful bacteria that are normally present in small amounts in the vagina. However, the reason that the condition develops is not fully understood. What increases the risk? The following factors may make you more likely to develop this condition:  Having a new sexual partner or multiple sexual partners.  Having unprotected sex.  Douching.  Having an intrauterine device (IUD).  Smoking.  Drug and alcohol abuse.  Taking certain antibiotic medicines.  Being pregnant.  You cannot get bacterial vaginosis from toilet seats, bedding, swimming pools, or contact with objects around you. What are the signs or symptoms? Symptoms of this condition include:  Grey or white vaginal discharge. The discharge can also be watery or foamy.  A fish-like odor with discharge, especially after sexual intercourse or during menstruation.  Itching in and around the vagina.  Burning or pain with urination.  Some women with bacterial vaginosis have no signs or symptoms. How is this diagnosed? This condition is diagnosed based on:  Your medical history.  A physical exam of the vagina.  Testing a sample of vaginal fluid under a microscope to look for a large amount of bad bacteria or abnormal cells. Your health care provider may use a cotton swab  or a small wooden spatula to collect the sample.  How is this treated? This condition is treated with antibiotics. These may be given as a pill, a vaginal cream, or a medicine that is put into the vagina (suppository). If the condition comes back after treatment, a second round of antibiotics may be needed. Follow these instructions at home: Medicines  Take over-the-counter and prescription medicines only as told by your health care provider.  Take or use your antibiotic as told by your health care provider. Do not stop taking or using the antibiotic even if you start to feel better. General instructions  If you have a female sexual partner, tell her that you have a vaginal infection. She should see her health care provider and be treated if she has symptoms. If you have a female sexual partner, he does not need treatment.  During treatment: ? Avoid sexual activity until you finish treatment. ? Do not douche. ? Avoid alcohol as directed by your health care provider. ? Avoid breastfeeding as directed by your health care provider.  Drink enough water and fluids to keep your urine clear or pale yellow.  Keep the area around your vagina and rectum clean. ? Wash the area daily with warm water. ? Wipe yourself from front to back after using the toilet.  Keep all follow-up visits as told by your health care provider. This is important. How is this prevented?  Do not douche.  Wash the outside of your vagina with warm water only.  Use protection when having sex. This includes latex condoms and dental dams.  Limit how many sexual partners you have. To help prevent bacterial vaginosis, it is best to have sex with just   one partner (monogamous).  Make sure you and your sexual partner are tested for STIs.  Wear cotton or cotton-lined underwear.  Avoid wearing tight pants and pantyhose, especially during summer.  Limit the amount of alcohol that you drink.  Do not use any products that  contain nicotine or tobacco, such as cigarettes and e-cigarettes. If you need help quitting, ask your health care provider.  Do not use illegal drugs. Where to find more information:  Centers for Disease Control and Prevention: www.cdc.gov/std  American Sexual Health Association (ASHA): www.ashastd.org  U.S. Department of Health and Human Services, Office on Women's Health: www.womenshealth.gov/ or https://www.womenshealth.gov/a-z-topics/bacterial-vaginosis Contact a health care provider if:  Your symptoms do not improve, even after treatment.  You have more discharge or pain when urinating.  You have a fever.  You have pain in your abdomen.  You have pain during sex.  You have vaginal bleeding between periods. Summary  Bacterial vaginosis is a vaginal infection that occurs when the normal balance of bacteria in the vagina is disrupted.  Because bacterial vaginosis increases your risk for STIs (sexually transmitted infections), getting treated can help reduce your risk for chlamydia, gonorrhea, herpes, and HIV (human immunodeficiency virus). Treatment is also important for preventing complications in pregnant women, because the condition can cause an early (premature) delivery.  This condition is treated with antibiotic medicines. These may be given as a pill, a vaginal cream, or a medicine that is put into the vagina (suppository). This information is not intended to replace advice given to you by your health care provider. Make sure you discuss any questions you have with your health care provider. Document Released: 09/02/2005 Document Revised: 01/06/2017 Document Reviewed: 05/18/2016 Elsevier Interactive Patient Education  2018 Elsevier Inc.  

## 2018-05-04 ENCOUNTER — Ambulatory Visit: Payer: Commercial Managed Care - PPO | Admitting: Obstetrics and Gynecology

## 2018-05-05 ENCOUNTER — Telehealth: Payer: Self-pay | Admitting: Emergency Medicine

## 2018-05-05 NOTE — Telephone Encounter (Signed)
Received phone call from Leda Gauze at [facility] with authorization for Breast MRI for patient.   Authorization number [id number]. Authorized from 04/21/18-05/20/18.   Called [facility] and spoke with [place]. Advised of authorization and patient is scheduled for screening mammogram on 05/14/18.   To Lerry Liner to update referral as necessary.   Detailed message left to patient okay per designated party release form to advise of approval and approval dates. Call back with any questions.   Encounter closed.

## 2018-05-11 NOTE — Progress Notes (Signed)
GYNECOLOGY  VISIT   HPI: 37 y.o.   Married  [place] American  female   [phone] with Patient's last menstrual period was 03/31/2018 (exact date).   here for post op TOTAL LAPAROSCOPIC HYSTERECTOMY WITH SALPINGECTOMY (Bilateral) . She is approximately 5 weeks post op and is feeling better. Her pain is better, now just occasional mild pain in the RLQ. Normal bowel or bladder function. No vaginal bleeding.  The patient requests referral to dermatology for recurrent boils. Currently has one on her chest and upper left thigh.   GYNECOLOGIC HISTORY: Patient's last menstrual period was 03/31/2018 (exact date). Contraception:Hysterectomy 04/07/2018 Menopausal hormone therapy: n/a        OB History    Gravida  3   Para  2   Term  2   Preterm  0   AB  1   Living  2     SAB      TAB  1   Ectopic      Multiple      Live Births  2              Patient Active Problem List   Diagnosis Date Noted  . Status post laparoscopic hysterectomy 04/07/2018  . Family history of breast cancer   . Need for Tdap vaccination 08/06/2011  . Obesity 08/06/2011    Past Medical History:  Diagnosis Date  . Abnormal uterine bleeding   . Anemia   . Dysmenorrhea   . Fibroid   . Obesity   . SVD (spontaneous vaginal delivery) 2007   x x    Past Surgical History:  Procedure Laterality Date  . ABDOMINAL HYSTERECTOMY    . CESAREAN SECTION  2009   x 1  . CYSTOSCOPY N/A 04/07/2018   Procedure: CYSTOSCOPY;  Surgeon: Salvadore Dom, MD;  Location: [facility];  Service: Gynecology;  Laterality: N/A;  . LAPAROSCOPIC LYSIS OF ADHESIONS  04/07/2018   Procedure: EXTENSIVE LAPAROSCOPIC LYSIS OF ADHESIONS;  Surgeon: Salvadore Dom, MD;  Location: [facility];  Service: Gynecology;;  . TOTAL LAPAROSCOPIC HYSTERECTOMY WITH SALPINGECTOMY Bilateral 04/07/2018   Procedure: TOTAL LAPAROSCOPIC HYSTERECTOMY WITH SALPINGECTOMY;  Surgeon: Salvadore Dom, MD;  Location: [facility];  Service: Gynecology;   Laterality: Bilateral;  3 hours OR time. BMI 51.21  . WISDOM TOOTH EXTRACTION      Current Outpatient Medications  Medication Sig Dispense Refill  . acetaminophen (TYLENOL) 325 MG tablet Take 2 tablets (650 mg total) by mouth every 4 (four) hours as needed for mild pain. 60 tablet 0  . ibuprofen (ADVIL,MOTRIN) 800 MG tablet Take 1 tablet (800 mg total) by mouth every 8 (eight) hours as needed. 30 tablet 1  . metroNIDAZOLE (FLAGYL) 500 MG tablet Take 1 tablet (500 mg total) by mouth 2 (two) times daily. 14 tablet 0   No current facility-administered medications for this visit.      ALLERGIES: Patient has no known allergies.  Family History  Problem Relation Age of Onset  . Diabetes Mother   . Hypertension Mother   . Breast cancer Mother 83  . Breast cancer Maternal Grandmother 60  . Heart disease Neg Hx   . Stroke Neg Hx     Social History   Socioeconomic History  . Marital status: Married    Spouse name: Not on file  . Number of children: 2  . Years of education: Not on file  . Highest education level: Not on file  Occupational History  . Occupation: Therapist, art  Employer: [employer]  Social Needs  . Financial resource strain: Not on file  . Food insecurity:    Worry: Not on file    Inability: Not on file  . Transportation needs:    Medical: Not on file    Non-medical: Not on file  Tobacco Use  . Smoking status: Never Smoker  . Smokeless tobacco: Never Used  Substance and Sexual Activity  . Alcohol use: No    Alcohol/week: 0.0 standard drinks  . Drug use: No  . Sexual activity: Yes    Partners: Male    Birth control/protection: Surgical    Comment: Hysterectomy  Lifestyle  . Physical activity:    Days per week: Not on file    Minutes per session: Not on file  . Stress: Not on file  Relationships  . Social connections:    Talks on phone: Not on file    Gets together: Not on file    Attends religious service: Not on file    Active member of club or  organization: Not on file    Attends meetings of clubs or organizations: Not on file    Relationship status: Not on file  . Intimate partner violence:    Fear of current or ex partner: Not on file    Emotionally abused: Not on file    Physically abused: Not on file    Forced sexual activity: Not on file  Other Topics Concern  . Not on file  Social History Narrative   Married, children ages 62yo and 25yo, walks for exercise    Review of Systems  Constitutional: Negative.   HENT: Negative.   Eyes: Negative.   Respiratory: Negative.   Cardiovascular: Negative.   Gastrointestinal: Negative.   Genitourinary: Negative.   Musculoskeletal: Negative.   Skin: Negative.   Neurological: Negative.   Endo/Heme/Allergies: Negative.   Psychiatric/Behavioral: Negative.   All other systems reviewed and are negative.   PHYSICAL EXAMINATION:    LMP 03/31/2018 (Exact Date)     General appearance: alert, cooperative and appears stated age Abdomen: soft, non-tender; non distended, no masses,  no organomegaly, incisions are healing well.  Upper left thigh with healing boil  Pelvic: External genitalia:  no lesions              Urethra:  normal appearing urethra with no masses, tenderness or lesions              Bartholins and Skenes: normal                 Vagina: normal appearing vagina with normal color and discharge, no lesions          Vaginal cuff: healing well, not tender               Cervix: absent              Bimanual Exam:  Uterus:  uterus absent              Adnexa: no mass, fullness, tenderness              Chaperone was present for exam.  ASSESSMENT 5 weeks s/p TLH/BS, doing well C/O recurrent boils    PLAN Has mammogram on Thursday, needs this prior to her breast MRI Will return to work next Tuesday Avoid intercourse for another 1-2 months F/U for annual exam in 4/20 Information on boils given, f/u with dermatology    An After Visit Summary was printed and given  to  the patient.   counseling.

## 2018-05-12 ENCOUNTER — Other Ambulatory Visit: Payer: Self-pay

## 2018-05-12 ENCOUNTER — Encounter: Payer: Self-pay | Admitting: Obstetrics and Gynecology

## 2018-05-12 ENCOUNTER — Ambulatory Visit (INDEPENDENT_AMBULATORY_CARE_PROVIDER_SITE_OTHER): Payer: Commercial Managed Care - PPO | Admitting: Obstetrics and Gynecology

## 2018-05-12 VITALS — BP 148/90 | HR 87 | Resp 18 | Ht 62.0 in | Wt 277.0 lb

## 2018-05-12 DIAGNOSIS — L0292 Furuncle, unspecified: Secondary | ICD-10-CM

## 2018-05-12 DIAGNOSIS — Z90710 Acquired absence of both cervix and uterus: Secondary | ICD-10-CM

## 2018-05-12 NOTE — Patient Instructions (Signed)

## 2018-05-14 ENCOUNTER — Ambulatory Visit: Payer: 59

## 2018-06-15 ENCOUNTER — Ambulatory Visit: Payer: 59

## 2018-08-02 ENCOUNTER — Other Ambulatory Visit: Payer: Self-pay

## 2018-08-02 ENCOUNTER — Emergency Department (HOSPITAL_COMMUNITY)
Admission: EM | Admit: 2018-08-02 | Discharge: 2018-08-02 | Disposition: A | Discharge status: Home / Self Care | Payer: Commercial Managed Care - PPO | Attending: Emergency Medicine | Admitting: Emergency Medicine

## 2018-08-02 ENCOUNTER — Encounter (HOSPITAL_COMMUNITY): Payer: Self-pay

## 2018-08-02 DIAGNOSIS — J029 Acute pharyngitis, unspecified: Secondary | ICD-10-CM | POA: Diagnosis present

## 2018-08-02 DIAGNOSIS — J039 Acute tonsillitis, unspecified: Secondary | ICD-10-CM | POA: Insufficient documentation

## 2018-08-02 LAB — GROUP A STREP BY PCR: Group A Strep by PCR: NOT DETECTED

## 2018-08-02 MED ORDER — AMOXICILLIN 500 MG PO CAPS: 500.0000 mg | ORAL_CAPSULE | Freq: Three times a day (TID) | ORAL | 0 refills | Status: DC

## 2018-08-02 NOTE — Discharge Instructions (Signed)
Contact a health care provider if: Large, tender lumps develop in your neck. A rash develops. A green, yellow-brown, or bloody substance is coughed up. You are unable to swallow liquids or food for 24 hours. You notice that only one of the tonsils is swollen. Get help right away if: You develop any new symptoms such as vomiting, severe headache, stiff neck, chest pain, or trouble breathing or swallowing. You have severe throat pain along with drooling or voice changes. You have severe pain, unrelieved with recommended medications. You are unable to fully open the mouth. You develop redness, swelling, or severe pain anywhere in the neck. You have a fever.

## 2018-08-02 NOTE — ED Triage Notes (Signed)
She c/o sore throat x 3 days. She is in no distress.

## 2018-08-02 NOTE — ED Notes (Signed)
Pt left before signing for D/C

## 2018-08-02 NOTE — ED Provider Notes (Signed)
[facility] DEPT Provider Note   CSN: [id number] Arrival date & time: 08/02/18  0906     History   Chief Complaint Chief Complaint  Patient presents with  . Sore Throat    HPI [name] is a 37 y.o. female.  Who presents emergency department with chief complaint of sore throat.  Patient has had 1 week of sore throat.  She was seen in a tele-medicine visit and told to take Cepacol and Tylenol.  She states that she feels of her throat is getting much worse.  She has noticed exudates all over her tonsils.  She denies inability to swallow saliva or fluids.  She denies change in voice, trismus.  She does have pain that radiates to her bilateral ears.  It is aching and moderate.  She denies any neck stiffness or fevers.  She has no contacts with similar symptoms.     Past Medical History:  Diagnosis Date  . Abnormal uterine bleeding   . Anemia   . Dysmenorrhea   . Fibroid   . Obesity   . SVD (spontaneous vaginal delivery) 2007   x x    Patient Active Problem List   Diagnosis Date Noted  . Status post laparoscopic hysterectomy 04/07/2018  . Family history of breast cancer   . Need for Tdap vaccination 08/06/2011  . Obesity 08/06/2011    Past Surgical History:  Procedure Laterality Date  . ABDOMINAL HYSTERECTOMY    . CESAREAN SECTION  2009   x 1  . CYSTOSCOPY N/A 04/07/2018   Procedure: CYSTOSCOPY;  Surgeon: Salvadore Dom, MD;  Location: [facility];  Service: Gynecology;  Laterality: N/A;  . LAPAROSCOPIC LYSIS OF ADHESIONS  04/07/2018   Procedure: EXTENSIVE LAPAROSCOPIC LYSIS OF ADHESIONS;  Surgeon: Salvadore Dom, MD;  Location: [facility];  Service: Gynecology;;  . TOTAL LAPAROSCOPIC HYSTERECTOMY WITH SALPINGECTOMY Bilateral 04/07/2018   Procedure: TOTAL LAPAROSCOPIC HYSTERECTOMY WITH SALPINGECTOMY;  Surgeon: Salvadore Dom, MD;  Location: [facility];  Service: Gynecology;  Laterality: Bilateral;  3 hours OR time. BMI 51.21  .  WISDOM TOOTH EXTRACTION       OB History    Gravida  3   Para  2   Term  2   Preterm  0   AB  1   Living  2     SAB      TAB  1   Ectopic      Multiple      Live Births  2            Home Medications    Prior to Admission medications   Medication Sig Start Date End Date Taking? Authorizing Provider  acetaminophen (TYLENOL) 325 MG tablet Take 2 tablets (650 mg total) by mouth every 4 (four) hours as needed for mild pain. 04/08/18  Yes Salvadore Dom, MD  lidocaine (XYLOCAINE) 2 % solution Use as directed 15 mLs in the mouth or throat as needed for mouth pain.   Yes [provider]  menthol-cetylpyridinium (CEPACOL) 3 MG lozenge Take 1 lozenge by mouth as needed for sore throat.   Yes [provider]  ibuprofen (ADVIL,MOTRIN) 800 MG tablet Take 1 tablet (800 mg total) by mouth every 8 (eight) hours as needed. Patient not taking: Reported on 08/02/2018 05/01/18   Salvadore Dom, MD    Family History Family History  Problem Relation Age of Onset  . Diabetes Mother   . Hypertension Mother   .  Breast cancer Mother 46  . Breast cancer Maternal Grandmother 66  . Heart disease Neg Hx   . Stroke Neg Hx     Social History Social History   Tobacco Use  . Smoking status: Never Smoker  . Smokeless tobacco: Never Used  Substance Use Topics  . Alcohol use: No    Alcohol/week: 0.0 standard drinks  . Drug use: No     Allergies   Patient has no known allergies.   Review of Systems Review of Systems Ten systems reviewed and are negative for acute change, except as noted in the HPI.    Physical Exam Updated Vital Signs BP (!) 151/63 (BP Location: Left Arm)   Pulse 85   Temp 98.8 F (37.1 C) (Oral)   Resp 16   LMP 03/31/2018 (Exact Date)   SpO2 100%   Physical Exam  Physical Exam  Nursing note and vitals reviewed. Constitutional: She is oriented to person, place, and time. She appears well-developed and well-nourished.  No distress.  HENT:  Head: Normocephalic and atraumatic.  Eyes: Conjunctivae normal and EOM are normal. Pupils are equal, round, and reactive to light. No scleral icterus.  Mouth: Patient with bilateral tonsillar hypertrophy, erythema and significant exudates.  Bilateral tonsillar adenopathy present with tenderness.  Normal phonation Neck: Normal range of motion.  Cardiovascular: Normal rate, regular rhythm and normal heart sounds.  Exam reveals no gallop and no friction rub.   No murmur heard. Pulmonary/Chest: Effort normal and breath sounds normal. No respiratory distress.  Abdominal: Soft. Bowel sounds are normal. She exhibits no distension and no mass. There is no tenderness. There is no guarding.  Neurological: She is alert and oriented to person, place, and time.  Skin: Skin is warm and dry. She is not diaphoretic.    ED Treatments / Results  Labs (all labs ordered are listed, but only abnormal results are displayed) Labs Reviewed  GROUP A STREP BY PCR    EKG None  Radiology No results found.  Procedures Procedures (including critical care time)  Medications Ordered in ED Medications - No data to display   Initial Impression / Assessment and Plan / ED Course  I have reviewed the triage vital signs and the nursing notes.  Pertinent labs & imaging results that were available during my care of the patient were reviewed by me and considered in my medical decision making (see chart for details).     Patient with negative strep test however given her symptoms I have high suspicion for negative rapid strep and will treat with amoxicillin.  Patient may continue with supportive care and gargling.  Patient is to follow-up with ear nose and throat or PCP if her symptoms are not improving.  She does not have any aunts evidence of peritonsillar abscess or retropharyngeal abscess patient is tolerating fluids.  She was appropriate for discharge at this time.  Final Clinical  Impressions(s) / ED Diagnoses   Final diagnoses:  None    ED Discharge Orders    None       Margarita Mail, PA-C 08/02/18 1716    Duffy Bruce, MD 08/04/18 [phone]

## 2018-10-01 ENCOUNTER — Encounter: Payer: Self-pay | Admitting: Emergency Medicine

## 2018-10-01 ENCOUNTER — Telehealth: Payer: Self-pay | Admitting: Emergency Medicine

## 2018-10-01 NOTE — Telephone Encounter (Signed)
Patient has not scheduled mammogram and/or Breast MRI that Dr. Talbert Nan ordered for her.   Risk for breast Cancer : Increased risk of breast cancer of 22.3%   Letter written and for Dr. Talbert Nan to sign for patient.

## 2018-10-12 ENCOUNTER — Ambulatory Visit (INDEPENDENT_AMBULATORY_CARE_PROVIDER_SITE_OTHER): Payer: Commercial Managed Care - PPO | Admitting: Obstetrics and Gynecology

## 2018-10-12 ENCOUNTER — Encounter: Payer: Self-pay | Admitting: Obstetrics and Gynecology

## 2018-10-12 VITALS — BP 148/76 | HR 80 | Resp 16 | Ht 62.0 in | Wt 274.0 lb

## 2018-10-12 DIAGNOSIS — B9689 Other specified bacterial agents as the cause of diseases classified elsewhere: Secondary | ICD-10-CM

## 2018-10-12 DIAGNOSIS — N760 Acute vaginitis: Secondary | ICD-10-CM

## 2018-10-12 MED ORDER — METRONIDAZOLE 500 MG PO TABS: 500.0000 mg | ORAL_TABLET | Freq: Two times a day (BID) | ORAL | 0 refills | Status: DC

## 2018-10-12 NOTE — Patient Instructions (Signed)
Vaginitis  Vaginitis is a condition in which the vaginal tissue swells and becomes red (inflamed). This condition is most often caused by a change in the normal balance of bacteria and yeast that live in the vagina. This change causes an overgrowth of certain bacteria or yeast, which causes the inflammation. There are different types of vaginitis, but the most common types are:   Bacterial vaginosis.   Yeast infection (candidiasis).   Trichomoniasis vaginitis. This is a sexually transmitted disease (STD).   Viral vaginitis.   Atrophic vaginitis.   Allergic vaginitis.  What are the causes?  The cause of this condition depends on the type of vaginitis. It can be caused by:   Bacteria (bacterial vaginosis).   Yeast, which is a fungus (yeast infection).   A parasite (trichomoniasis vaginitis).   A virus (viral vaginitis).   Low hormone levels (atrophic vaginitis). Low hormone levels can occur during pregnancy, breastfeeding, or after menopause.   Irritants, such as bubble baths, scented tampons, and feminine sprays (allergic vaginitis).  Other factors can change the normal balance of the yeast and bacteria that live in the vagina. These include:   Antibiotic medicines.   Poor hygiene.   Diaphragms, vaginal sponges, spermicides, birth control pills, and intrauterine devices (IUD).   Sex.   Infection.   Uncontrolled diabetes.   A weakened defense (immune) system.  What increases the risk?  This condition is more likely to develop in women who:   Smoke.   Use vaginal douches, scented tampons, or scented sanitary pads.   Wear tight-fitting pants.   Wear thong underwear.   Use oral birth control pills or an IUD.   Have sex without a condom.   Have multiple sex partners.   Have an STD.   Frequently use the spermicide nonoxynol-9.   Eat lots of foods high in sugar.   Have uncontrolled diabetes.   Have low estrogen levels.   Have a weakened immune system from an immune disorder or medical  treatment.   Are pregnant or breastfeeding.  What are the signs or symptoms?  Symptoms vary depending on the cause of the vaginitis. Common symptoms include:   Abnormal vaginal discharge.  ? The discharge is white, gray, or yellow with bacterial vaginosis.  ? The discharge is thick, white, and cheesy with a yeast infection.  ? The discharge is frothy and yellow or greenish with trichomoniasis.   A bad vaginal smell. The smell is fishy with bacterial vaginosis.   Vaginal itching, pain, or swelling.   Sex that is painful.   Pain or burning when urinating.  Sometimes there are no symptoms.  How is this diagnosed?  This condition is diagnosed based on your symptoms and medical history. A physical exam, including a pelvic exam, will also be done. You may also have other tests, including:   Tests to determine the pH level (acidity or alkalinity) of your vagina.   A whiff test, to assess the odor that results when a sample of your vaginal discharge is mixed with a potassium hydroxide solution.   Tests of vaginal fluid. A sample will be examined under a microscope.  How is this treated?  Treatment varies depending on the type of vaginitis you have. Your treatment may include:   Antibiotic creams or pills to treat bacterial vaginosis and trichomoniasis.   Antifungal medicines, such as vaginal creams or suppositories, to treat a yeast infection.   Medicine to ease discomfort if you have viral vaginitis. Your sexual partner   should also be treated.   Estrogen delivered in a cream, pill, suppository, or vaginal ring to treat atrophic vaginitis. If vaginal dryness occurs, lubricants and moisturizing creams may help. You may need to avoid scented soaps, sprays, or douches.   Stopping use of a product that is causing allergic vaginitis. Then using a vaginal cream to treat the symptoms.  Follow these instructions at home:  Lifestyle   Keep your genital area clean and dry. Avoid soap, and only rinse the area with  water.   Do not douche or use tampons until your health care provider says it is okay to do so. Use sanitary pads, if needed.   Do not have sex until your health care provider approves. When you can return to sex, practice safe sex and use condoms.   Wipe from front to back. This avoids the spread of bacteria from the rectum to the vagina.  General instructions   Take over-the-counter and prescription medicines only as told by your health care provider.   If you were prescribed an antibiotic medicine, take or use it as told by your health care provider. Do not stop taking or using the antibiotic even if you start to feel better.   Keep all follow-up visits as told by your health care provider. This is important.  How is this prevented?   Use mild, non-scented products. Do not use things that can irritate the vagina, such as fabric softeners. Avoid the following products if they are scented:  ? Feminine sprays.  ? Detergents.  ? Tampons.  ? Feminine hygiene products.  ? Soaps or bubble baths.   Let air reach your genital area.  ? Wear cotton underwear to reduce moisture buildup.  ? Avoid wearing underwear while you sleep.  ? Avoid wearing tight pants and underwear or nylons without a cotton panel.  ? Avoid wearing thong underwear.   Take off any wet clothing, such as bathing suits, as soon as possible.   Practice safe sex and use condoms.  Contact a health care provider if:   You have abdominal pain.   You have a fever.   You have symptoms that last for more than 2-3 days.  Get help right away if:   You have a fever and your symptoms suddenly get worse.  Summary   Vaginitis is a condition in which the vaginal tissue becomes inflamed.This condition is most often caused by a change in the normal balance of bacteria and yeast that live in the vagina.   Treatment varies depending on the type of vaginitis you have.   Do not douche, use tampons , or have sex until your health care provider approves. When  you can return to sex, practice safe sex and use condoms.  This information is not intended to replace advice given to you by your health care provider. Make sure you discuss any questions you have with your health care provider.  Document Released: 06/30/2007 Document Revised: 10/08/2016 Document Reviewed: 10/08/2016  Elsevier Interactive Patient Education  2019 [employer]vier Inc[employer]

## 2018-10-12 NOTE — Progress Notes (Signed)
GYNECOLOGY  VISIT   HPI: 38 y.o.   Married Black or [place] American Not Hispanic or Latino  female   [phone] with Patient's last menstrual period was 03/31/2018 (exact date).   here for vaginal odor that started Friday 10/09/18. Per patient, vaginal odor smells "dead".  No vaginal discharge. Started 1-2 days after being sexually active. No itching, burning or irritation.   GYNECOLOGIC HISTORY: Patient's last menstrual period was 03/31/2018 (exact date). Contraception:Hysterectomy Menopausal hormone therapy: none        OB History    Gravida  3   Para  2   Term  2   Preterm  0   AB  1   Living  2     SAB      TAB  1   Ectopic      Multiple      Live Births  2              Patient Active Problem List   Diagnosis Date Noted  . Status post laparoscopic hysterectomy 04/07/2018  . Family history of breast cancer   . Need for Tdap vaccination 08/06/2011  . Obesity 08/06/2011    Past Medical History:  Diagnosis Date  . Abnormal uterine bleeding   . Anemia   . Dysmenorrhea   . Fibroid   . Obesity   . SVD (spontaneous vaginal delivery) 2007   x x    Past Surgical History:  Procedure Laterality Date  . ABDOMINAL HYSTERECTOMY    . CESAREAN SECTION  2009   x 1  . CYSTOSCOPY N/A 04/07/2018   Procedure: CYSTOSCOPY;  Surgeon: Salvadore Dom, MD;  Location: [facility];  Service: Gynecology;  Laterality: N/A;  . LAPAROSCOPIC LYSIS OF ADHESIONS  04/07/2018   Procedure: EXTENSIVE LAPAROSCOPIC LYSIS OF ADHESIONS;  Surgeon: Salvadore Dom, MD;  Location: [facility];  Service: Gynecology;;  . TOTAL LAPAROSCOPIC HYSTERECTOMY WITH SALPINGECTOMY Bilateral 04/07/2018   Procedure: TOTAL LAPAROSCOPIC HYSTERECTOMY WITH SALPINGECTOMY;  Surgeon: Salvadore Dom, MD;  Location: [facility];  Service: Gynecology;  Laterality: Bilateral;  3 hours OR time. BMI 51.21  . WISDOM TOOTH EXTRACTION      Current Outpatient Medications  Medication Sig Dispense Refill  . acetaminophen  (TYLENOL) 325 MG tablet Take 2 tablets (650 mg total) by mouth every 4 (four) hours as needed for mild pain. 60 tablet 0  . amoxicillin (AMOXIL) 500 MG capsule Take 1 capsule (500 mg total) by mouth 3 (three) times daily. 21 capsule 0  . ibuprofen (ADVIL,MOTRIN) 800 MG tablet Take 1 tablet (800 mg total) by mouth every 8 (eight) hours as needed. (Patient not taking: Reported on 08/02/2018) 30 tablet 1  . lidocaine (XYLOCAINE) 2 % solution Use as directed 15 mLs in the mouth or throat as needed for mouth pain.    Marland Kitchen menthol-cetylpyridinium (CEPACOL) 3 MG lozenge Take 1 lozenge by mouth as needed for sore throat.     No current facility-administered medications for this visit.      ALLERGIES: Patient has no known allergies.  Family History  Problem Relation Age of Onset  . Diabetes Mother   . Hypertension Mother   . Breast cancer Mother 49  . Breast cancer Maternal Grandmother 75  . Heart disease Neg Hx   . Stroke Neg Hx     Social History   Socioeconomic History  . Marital status: Married    Spouse name: Not on file  . Number of children: 2  . Years  of education: Not on file  . Highest education level: Not on file  Occupational History  . Occupation: Air cabin crew: AT [employer]  . Financial resource strain: Not on file  . Food insecurity:    Worry: Not on file    Inability: Not on file  . Transportation needs:    Medical: Not on file    Non-medical: Not on file  Tobacco Use  . Smoking status: Never Smoker  . Smokeless tobacco: Never Used  Substance and Sexual Activity  . Alcohol use: No    Alcohol/week: 0.0 standard drinks  . Drug use: No  . Sexual activity: Yes    Partners: Male    Birth control/protection: Surgical    Comment: Hysterectomy  Lifestyle  . Physical activity:    Days per week: Not on file    Minutes per session: Not on file  . Stress: Not on file  Relationships  . Social connections:    Talks on phone: Not on file     Gets together: Not on file    Attends religious service: Not on file    Active member of club or organization: Not on file    Attends meetings of clubs or organizations: Not on file    Relationship status: Not on file  . Intimate partner violence:    Fear of current or ex partner: Not on file    Emotionally abused: Not on file    Physically abused: Not on file    Forced sexual activity: Not on file  Other Topics Concern  . Not on file  Social History Narrative   Married, children ages 55yo and 59yo, walks for exercise    Review of Systems  Constitutional: Negative.   HENT: Negative.   Eyes: Negative.   Respiratory: Negative.   Cardiovascular: Negative.   Gastrointestinal: Negative.   Genitourinary:       Vaginal odor  Musculoskeletal: Negative.   Skin: Negative.   Neurological: Negative.   Endo/Heme/Allergies: Negative.   Psychiatric/Behavioral: Negative.     PHYSICAL EXAMINATION:    LMP 03/31/2018 (Exact Date)     General appearance: alert, cooperative and appears stated age  Pelvic: External genitalia:  no lesions              Urethra:  normal appearing urethra with no masses, tenderness or lesions              Bartholins and Skenes: normal                 Vagina: normal appearing vagina with a slight increase in watery, frothy vaginal discharge             Cervix:absent  Chaperone was present for exam.  Wet prep: ++ clue, no trich, no wbc KOH: no yeast PH: 5   ASSESSMENT Bacterial vaginitis    PLAN Treat with Flagyl (no ETOH)   An After Visit Summary was printed and given to the patient.

## 2018-12-03 ENCOUNTER — Encounter: Payer: Self-pay | Admitting: Emergency Medicine

## 2018-12-03 NOTE — Telephone Encounter (Signed)
Letter to Dr. Talbert Nan and is signed. Mailed via [place] mail today to home address of record.   Will close encounter.

## 2019-06-08 ENCOUNTER — Encounter: Payer: Self-pay | Admitting: Gynecology

## 2019-09-02 ENCOUNTER — Ambulatory Visit: Payer: Commercial Managed Care - PPO | Admitting: Family Medicine

## 2019-09-03 ENCOUNTER — Ambulatory Visit: Payer: Commercial Managed Care - PPO | Admitting: Family Medicine

## 2019-09-07 ENCOUNTER — Ambulatory Visit (INDEPENDENT_AMBULATORY_CARE_PROVIDER_SITE_OTHER): Payer: Commercial Managed Care - PPO | Admitting: Adult Health Nurse Practitioner

## 2019-09-07 ENCOUNTER — Encounter: Payer: Self-pay | Admitting: Adult Health Nurse Practitioner

## 2019-09-07 ENCOUNTER — Other Ambulatory Visit: Payer: Self-pay

## 2019-09-07 VITALS — BP 160/82 | HR 87 | Temp 97.8°F | Ht 63.0 in | Wt 286.2 lb

## 2019-09-07 DIAGNOSIS — Z1329 Encounter for screening for other suspected endocrine disorder: Secondary | ICD-10-CM | POA: Diagnosis not present

## 2019-09-07 DIAGNOSIS — I10 Essential (primary) hypertension: Secondary | ICD-10-CM | POA: Insufficient documentation

## 2019-09-07 DIAGNOSIS — N63 Unspecified lump in unspecified breast: Secondary | ICD-10-CM | POA: Diagnosis not present

## 2019-09-07 DIAGNOSIS — R5383 Other fatigue: Secondary | ICD-10-CM | POA: Diagnosis not present

## 2019-09-07 DIAGNOSIS — Z803 Family history of malignant neoplasm of breast: Secondary | ICD-10-CM

## 2019-09-07 DIAGNOSIS — L309 Dermatitis, unspecified: Secondary | ICD-10-CM | POA: Diagnosis not present

## 2019-09-07 MED ORDER — LISINOPRIL-HYDROCHLOROTHIAZIDE 10-12.5 MG PO TABS: 1.0000 | ORAL_TABLET | Freq: Every day | ORAL | 2 refills | Status: DC

## 2019-09-07 MED ORDER — ALUM SULFATE-CA ACETATE EX PACK: PACK | CUTANEOUS | 6 refills | Status: DC

## 2019-09-07 MED ORDER — CICLOPIROX OLAMINE 0.77 % EX CREA: TOPICAL_CREAM | Freq: Two times a day (BID) | CUTANEOUS | 0 refills | Status: DC

## 2019-09-07 NOTE — Patient Instructions (Signed)
° ° ° °  If you have lab work done today you will be contacted with your lab results within the next 2 weeks.  If you have not heard from us then please contact us. The fastest way to get your results is to register for My Chart. ° ° °IF you received an x-ray today, you will receive an invoice from [facility]. Please contact [facility] at [phone] with questions or concerns regarding your invoice.  ° °IF you received labwork today, you will receive an invoice from [facility]. Please contact [facility] at [phone] with questions or concerns regarding your invoice.  ° °Our billing staff will not be able to assist you with questions regarding bills from these companies. ° °You will be contacted with the lab results as soon as they are available. The fastest way to get your results is to activate your My Chart account. Instructions are located on the last page of this paperwork. If you have not heard from us regarding the results in 2 weeks, please contact this office. °  ° ° ° °

## 2019-09-07 NOTE — Progress Notes (Signed)
.  Chief Complaint  Patient presents with  . Establish Care    Pt declined Flu vac. Pt stated that she has a spot on her left/Rt breast that she is concerned about. It has been there for the past 2 months and is not painful or itchy.    HPI   Patient presents to establish care.  She has a family hx of breast cancer in mother and maternal GM.  Mother still alive.  Unknown type of Breast Cancer.  Last mammo was in 2018.  Patient notes a spot on the left breast, lump, to lower quadrant, hard, adjacent to sternum.  Unchanged in 2 months.  The right breast has a lump which waxes and wanes in size and consistentcy at 6 pm on the right.  Non-tender.   She has some skin changes and irritation around the lower bra line that causes irritation as well which she is inquiring about today.   She has not eaten today and last labs 1 year ago showed anemia.  In addition, blood pressure elevated today on visit.  Patient reports last BP measurements were elevated as well.  Denies CP, chest pressure, SOB.  No headache or blurry vision.  Never been on BP.  She does have a cuff at home where she could monitor.    Problem List    Problem List: 2020-12: Breast mass 2019-07: Status post laparoscopic hysterectomy 2012-11: Lump of breast, left 2012-11: Need for Tdap vaccination 2012-11: Obesity Family history of breast cancer   Allergies   has No Known Allergies.  Medications    Current Outpatient Medications:  .  cyanocobalamin 100 MCG tablet, Take 100 mcg by mouth daily., Disp: , Rfl:  .  lisinopril-hydrochlorothiazide (ZESTORETIC) 10-12.5 MG tablet, Take 1 tablet by mouth daily., Disp: 30 tablet, Rfl: 2 .  metroNIDAZOLE (FLAGYL) 500 MG tablet, Take 1 tablet (500 mg total) by mouth 2 (two) times daily. (Patient not taking: Reported on 09/07/2019), Disp: 14 tablet, Rfl: 0   Review of Systems    Constitutional: Negative for activity change, appetite change, chills and fever.  HENT: Negative for  congestion, nosebleeds, trouble swallowing and voice change.   Respiratory: Negative for cough, shortness of breath and wheezing.   Breasts: mass palpated bilaterally Gastrointestinal: Negative for diarrhea, nausea and vomiting.  Genitourinary: Negative for difficulty urinating, dysuria, flank pain and hematuria.  Musculoskeletal: Negative for back pain, joint swelling and neck pain.  Neurological: Negative for dizziness, speech difficulty, light-headedness and numbness.  See HPI. All other review of systems negative.     Physical Exam:   Physical Examination: General appearance - alert, well appearing, and in no distress and oriented to person, place, and time Mental status - normal mood, behavior, speech, dress, motor activity, and thought processes Eyes - not examined, Visually impaired -Peter's anomaly Neck - supple, no significant adenopathy, carotids upstroke normal bilaterally, no bruits, thyroid exam: thyroid is normal in size without nodules or tenderness Chest - clear to auscultation, no wheezes, rales or rhonchi, symmetric air entry  Breast exam: abnormal mass palpable on the right at 6 o'clock, papates as fibrous, non-tender. outer quadrant, left at 7 oclock outer quadrant, non-tender, about 2 x 1 cm, . Heart - normal rate, regular rhythm, normal S1, S2, no murmurs, rubs, clicks or gallops Extremities - dependent LE edema without clubbing or cyanosis Skin - normal coloration and turgor, no rashes, no suspicious skin lesions noted  No hyperpigmentation of skin.  No current hematomas noted  Lab Review   labs reviewed, I note that hemogram normal, mildly abnormal but acceptable, updated screening labs.   Assessment & Plan:  [name] is a 38 y.o. female . 1. Breast mass   2. Fatigue, unspecified type   3. Screening for thyroid disorder    Orders Placed This Encounter  Procedures  . MM Digital Diagnostic Bilat  . Comprehensive metabolic panel  . Lipid Panel  .  Thyroid Panel With TSH  . POCT urinalysis dipstick  . POCT CBC   Meds ordered this encounter  Medications  . lisinopril-hydrochlorothiazide (ZESTORETIC) 10-12.5 MG tablet    Sig: Take 1 tablet by mouth daily.    Dispense:  30 tablet    Refill:  2   I have asked her to record a few blood pressures a week to evaluate whether BP is coming down on new medication.  She is amenable to this plan. All questions were answered.   Glyn Ade, NP

## 2019-09-08 LAB — COMPREHENSIVE METABOLIC PANEL
ALT: 11 IU/L (ref 0–32)
AST: 12 IU/L (ref 0–40)
Albumin/Globulin Ratio: 0.9 — ABNORMAL LOW (ref 1.2–2.2)
Albumin: 3.6 g/dL — ABNORMAL LOW (ref 3.8–4.8)
Alkaline Phosphatase: 61 IU/L (ref 39–117)
BUN/Creatinine Ratio: 13 (ref 9–23)
BUN: 8 mg/dL (ref 6–20)
Bilirubin Total: 0.3 mg/dL (ref 0.0–1.2)
CO2: 23 mmol/L (ref 20–29)
Calcium: 9.2 mg/dL (ref 8.7–10.2)
Chloride: 103 mmol/L (ref 96–106)
Creatinine, Ser: 0.62 mg/dL (ref 0.57–1.00)
GFR calc Af Amer: 131 mL/min/{1.73_m2} (ref >59)
GFR calc non Af Amer: 114 mL/min/{1.73_m2} (ref >59)
Globulin, Total: 3.9 g/dL (ref 1.5–4.5)
Glucose: 83 mg/dL (ref 65–99)
Potassium: 4.4 mmol/L (ref 3.5–5.2)
Sodium: 139 mmol/L (ref 134–144)
Total Protein: 7.5 g/dL (ref 6.0–8.5)

## 2019-09-08 LAB — LIPID PANEL
Chol/HDL Ratio: 3.3 ratio (ref 0.0–4.4)
Cholesterol, Total: 190 mg/dL (ref 100–199)
HDL: 57 mg/dL (ref >39)
LDL Chol Calc (NIH): 122 mg/dL — ABNORMAL HIGH (ref 0–99)
Triglycerides: 57 mg/dL (ref 0–149)
VLDL Cholesterol Cal: 11 mg/dL (ref 5–40)

## 2019-09-08 LAB — THYROID PANEL WITH TSH
Free Thyroxine Index: 1.7 (ref 1.2–4.9)
T3 Uptake Ratio: 20 % — ABNORMAL LOW (ref 24–39)
T4, Total: 8.4 ug/dL (ref 4.5–12.0)
TSH: 1.15 u[IU]/mL (ref 0.450–4.500)

## 2019-10-08 ENCOUNTER — Ambulatory Visit: Payer: Commercial Managed Care - PPO | Admitting: Adult Health Nurse Practitioner

## 2019-11-22 ENCOUNTER — Telehealth: Payer: Self-pay | Admitting: Adult Health Nurse Practitioner

## 2019-11-22 NOTE — Telephone Encounter (Signed)
Pt called regarding her referral about a lump on her left breast when she saw provider on 09/07/2019. [phone]. Please advise.

## 2019-11-22 NOTE — Telephone Encounter (Signed)
Spoke with pt, informed her that referral has been sent to [facility].

## 2019-11-23 ENCOUNTER — Other Ambulatory Visit: Payer: Self-pay

## 2019-11-23 ENCOUNTER — Other Ambulatory Visit: Payer: Self-pay | Admitting: Adult Health Nurse Practitioner

## 2019-11-23 ENCOUNTER — Encounter: Payer: Self-pay | Admitting: Family Medicine

## 2019-11-23 ENCOUNTER — Ambulatory Visit (INDEPENDENT_AMBULATORY_CARE_PROVIDER_SITE_OTHER): Payer: Commercial Managed Care - PPO | Admitting: Family Medicine

## 2019-11-23 VITALS — BP 154/92 | HR 85 | Temp 98.0°F | Ht 63.0 in | Wt 286.0 lb

## 2019-11-23 DIAGNOSIS — N63 Unspecified lump in unspecified breast: Secondary | ICD-10-CM

## 2019-11-23 DIAGNOSIS — N6082 Other benign mammary dysplasias of left breast: Secondary | ICD-10-CM

## 2019-11-23 DIAGNOSIS — I10 Essential (primary) hypertension: Secondary | ICD-10-CM | POA: Diagnosis not present

## 2019-11-23 MED ORDER — AMLODIPINE BESYLATE 5 MG PO TABS: 5.0000 mg | ORAL_TABLET | Freq: Every day | ORAL | 1 refills | Status: DC

## 2019-11-23 MED ORDER — CEPHALEXIN 500 MG PO CAPS: 500.0000 mg | ORAL_CAPSULE | Freq: Four times a day (QID) | ORAL | 0 refills | Status: DC

## 2019-11-23 NOTE — Patient Instructions (Addendum)
  We recommend that you schedule a mammogram for breast cancer screening. Typically, you do not need a referral to do this. Please contact a local imaging center to schedule your mammogram.  [facility] - [phone] or [phone]     If you have lab work done today you will be contacted with your lab results within the next 2 weeks.  If you have not heard from us then please contact us. The fastest way to get your results is to register for My Chart.   IF you received an x-ray today, you will receive an invoice from [facility]. Please contact [facility] at [phone] with questions or concerns regarding your invoice.   IF you received labwork today, you will receive an invoice from [facility]. Please contact [facility] at [phone] with questions or concerns regarding your invoice.   Our billing staff will not be able to assist you with questions regarding bills from these companies.  You will be contacted with the lab results as soon as they are available. The fastest way to get your results is to activate your My Chart account. Instructions are located on the last page of this paperwork. If you have not heard from us regarding the results in 2 weeks, please contact this office.     

## 2019-11-23 NOTE — Progress Notes (Signed)
3/9/20214:05 PM  [name] 1981-03-16, 39 y.o., female [id number]  Chief Complaint  Patient presents with  . lump on L breast x 1 month  . Hypertension    request for a different medication     HPI:   Patient is a 39 y.o. female who presents today for left breast mass  Seen in Dec 2020 by Felton Clinton NP for bilateral breast lumps fhx breast cancer mother and [facility] Mammo ordered but has not been scheduled yet She reports LEFT breast mass comes and goes, tends to be tender, no drainage. Started on lisinopril-hctz but she has not taken it yet as concerned about possible side effects as her husband was recently taken off from it    Depression screen [facility] 2/9 11/23/2019 09/07/2019  Decreased Interest 0 0  Down, Depressed, Hopeless 0 0  PHQ - 2 Score 0 0    Fall Risk  11/23/2019 09/07/2019  Falls in the past year? 0 0  Number falls in past yr: 0 0  Injury with Fall? 0 0  Follow up Falls evaluation completed Falls evaluation completed     No Known Allergies  Prior to Admission medications   Medication Sig Start Date End Date Taking? Authorizing Provider  lisinopril-hydrochlorothiazide (ZESTORETIC) 10-12.5 MG tablet Take 1 tablet by mouth daily. 09/07/19 10/07/19  Wendall Mola, NP    Past Medical History:  Diagnosis Date  . Abnormal uterine bleeding   . Anemia   . Dysmenorrhea   . Fibroid   . Obesity   . SVD (spontaneous vaginal delivery) 2007   x x    Past Surgical History:  Procedure Laterality Date  . ABDOMINAL HYSTERECTOMY    . CESAREAN SECTION  2009   x 1  . CYSTOSCOPY N/A 04/07/2018   Procedure: CYSTOSCOPY;  Surgeon: Salvadore Dom, MD;  Location: [facility];  Service: Gynecology;  Laterality: N/A;  . LAPAROSCOPIC LYSIS OF ADHESIONS  04/07/2018   Procedure: EXTENSIVE LAPAROSCOPIC LYSIS OF ADHESIONS;  Surgeon: Salvadore Dom, MD;  Location: [facility];  Service: Gynecology;;  . TOTAL LAPAROSCOPIC HYSTERECTOMY WITH SALPINGECTOMY Bilateral 04/07/2018   Procedure: TOTAL LAPAROSCOPIC HYSTERECTOMY WITH SALPINGECTOMY;  Surgeon: Salvadore Dom, MD;  Location: [facility];  Service: Gynecology;  Laterality: Bilateral;  3 hours OR time. BMI 51.21  . WISDOM TOOTH EXTRACTION      Social History   Tobacco Use  . Smoking status: Never Smoker  . Smokeless tobacco: Never Used  Substance Use Topics  . Alcohol use: No    Alcohol/week: 0.0 standard drinks    Family History  Problem Relation Age of Onset  . Diabetes Mother   . Hypertension Mother   . Breast cancer Mother 62  . Cancer Mother   . Breast cancer Maternal Grandmother 73  . Heart disease Neg Hx   . Stroke Neg Hx     Review of Systems  Constitutional: Negative for chills and fever.  Respiratory: Negative for cough and shortness of breath.   Cardiovascular: Negative for chest pain, palpitations and leg swelling.  Gastrointestinal: Negative for abdominal pain, nausea and vomiting.     OBJECTIVE:  Today's Vitals   11/23/19 1603  BP: (!) 153/90  Pulse: 85  Temp: 98 F (36.7 C)  TempSrc: Temporal  SpO2: 98%  Weight: 286 lb (129.7 kg)  Height: 5\' 3"  (1.6 m)   Body mass index is 50.66 kg/m.   Physical Exam Vitals and nursing note reviewed. Exam conducted with a chaperone present.  Constitutional:  Appearance: She is well-developed.  HENT:     Head: Normocephalic and atraumatic.  Eyes:     General: No scleral icterus.    Conjunctiva/sclera: Conjunctivae normal.     Pupils: Pupils are equal, round, and reactive to light.  Pulmonary:     Effort: Pulmonary effort is normal.  Chest:     Breasts:        Right: Mass (7 oclock mobile non tender deep cystic mass) present. No nipple discharge or skin change.        Left: Mass (at inferior inner edge of breast and sterum a firm rubbery lump with overlying skin erythema and TTP) present. No nipple discharge or skin change.  Musculoskeletal:     Cervical back: Neck supple.  Lymphadenopathy:     Upper Body:     Right  upper body: No supraclavicular, axillary or pectoral adenopathy.     Left upper body: No supraclavicular, axillary or pectoral adenopathy.  Skin:    General: Skin is warm and dry.  Neurological:     Mental Status: She is alert and oriented to person, place, and time.     No results found for this or any previous visit (from the past 24 hour(s)).  No results found.   ASSESSMENT and PLAN  1. Breast mass MM ordered. Patient given phone number to schedule  2. Sebaceous cyst of breast, left rx for keflex given, discussed warm compresses  3. Essential hypertension Trial of amlodipine. Reviewed r/se/b  Other orders - amLODipine (NORVASC) 5 MG tablet; Take 1 tablet (5 mg total) by mouth daily. - cephALEXin (KEFLEX) 500 MG capsule; Take 1 capsule (500 mg total) by mouth 4 (four) times daily.  Return for as scheduled.    Rutherford Guys, MD Primary Care at [place] [address], [place] [address] Ph.  [phone] Fax [phone]

## 2019-12-08 ENCOUNTER — Other Ambulatory Visit: Payer: Self-pay | Admitting: Obstetrics and Gynecology

## 2019-12-08 DIAGNOSIS — N63 Unspecified lump in unspecified breast: Secondary | ICD-10-CM

## 2019-12-09 ENCOUNTER — Ambulatory Visit
Admission: RE | Admit: 2019-12-09 | Discharge: 2019-12-09 | Disposition: A | Discharge status: Home / Self Care | Payer: 59 | Source: Ambulatory Visit | Attending: Adult Health Nurse Practitioner | Admitting: Adult Health Nurse Practitioner

## 2019-12-09 ENCOUNTER — Ambulatory Visit
Admission: RE | Admit: 2019-12-09 | Discharge: 2019-12-09 | Disposition: A | Discharge status: Home / Self Care | Payer: Commercial Managed Care - PPO | Source: Ambulatory Visit | Attending: Adult Health Nurse Practitioner | Admitting: Adult Health Nurse Practitioner

## 2019-12-09 ENCOUNTER — Ambulatory Visit
Admission: RE | Admit: 2019-12-09 | Discharge: 2019-12-09 | Disposition: A | Discharge status: Home / Self Care | Payer: 59 | Source: Ambulatory Visit | Attending: Obstetrics and Gynecology | Admitting: Obstetrics and Gynecology

## 2019-12-09 ENCOUNTER — Other Ambulatory Visit: Payer: Self-pay | Admitting: Obstetrics and Gynecology

## 2019-12-09 ENCOUNTER — Other Ambulatory Visit: Payer: Self-pay

## 2019-12-09 DIAGNOSIS — N63 Unspecified lump in unspecified breast: Secondary | ICD-10-CM

## 2019-12-30 ENCOUNTER — Encounter: Payer: Commercial Managed Care - PPO | Admitting: Family Medicine

## 2020-01-11 ENCOUNTER — Encounter: Payer: Self-pay | Admitting: Family Medicine

## 2020-01-11 ENCOUNTER — Telehealth: Payer: Self-pay | Admitting: Family Medicine

## 2020-01-11 ENCOUNTER — Other Ambulatory Visit: Payer: Self-pay

## 2020-01-11 ENCOUNTER — Ambulatory Visit (INDEPENDENT_AMBULATORY_CARE_PROVIDER_SITE_OTHER): Payer: Commercial Managed Care - PPO | Admitting: Family Medicine

## 2020-01-11 VITALS — BP 133/88 | HR 95 | Temp 98.0°F | Ht 62.0 in | Wt 283.0 lb

## 2020-01-11 DIAGNOSIS — R030 Elevated blood-pressure reading, without diagnosis of hypertension: Secondary | ICD-10-CM | POA: Diagnosis not present

## 2020-01-11 DIAGNOSIS — I10 Essential (primary) hypertension: Secondary | ICD-10-CM

## 2020-01-11 DIAGNOSIS — N6089 Other benign mammary dysplasias of unspecified breast: Secondary | ICD-10-CM

## 2020-01-11 NOTE — Progress Notes (Signed)
4/27/20213:07 PM  [name] [birth year], 39 y.o., female [id number]  Chief Complaint  Patient presents with  . R breast cyst    HPI:   Patient is a 39 y.o. female who presents today for followup  Last OV march 2021 - ordered mammo, started keflex for sebaceous cyst, started amlodipine for BP  Had mammogram with [place] December 09 2019 - birads 2 Completed abx, sebaceous cyst much improved  Stopped taking amlodipine as she was getting really tired She has been checking BP at home twice a day She reports ~ 125/80 Has been cutting back on soda and salt Has been thinking about walking more  Wt Readings from Last 3 Encounters:  01/11/20 283 lb (128.4 kg)  11/23/19 286 lb (129.7 kg)  09/07/19 286 lb 3.2 oz (129.8 kg)    Depression screen [facility] 2/9 01/11/2020 11/23/2019 09/07/2019  Decreased Interest 0 0 0  Down, Depressed, Hopeless 0 0 0  PHQ - 2 Score 0 0 0    Fall Risk  01/11/2020 11/23/2019 09/07/2019  Falls in the past year? 0 0 0  Number falls in past yr: 0 0 0  Injury with Fall? 0 0 0  Follow up Falls evaluation completed Falls evaluation completed Falls evaluation completed     No Known Allergies  Prior to Admission medications   Medication Sig Start Date End Date Taking? Authorizing Provider  amLODipine (NORVASC) 5 MG tablet Take 1 tablet (5 mg total) by mouth daily. Patient not taking: Reported on 01/11/2020 11/23/19   Rutherford Guys, MD  cephALEXin (KEFLEX) 500 MG capsule Take 1 capsule (500 mg total) by mouth 4 (four) times daily. Patient not taking: Reported on 01/11/2020 11/23/19   Rutherford Guys, MD    Past Medical History:  Diagnosis Date  . Abnormal uterine bleeding   . Anemia   . Dysmenorrhea   . Fibroid   . Obesity   . SVD (spontaneous vaginal delivery) 2007   x x    Past Surgical History:  Procedure Laterality Date  . ABDOMINAL HYSTERECTOMY    . CESAREAN SECTION  2009   x 1  . CYSTOSCOPY N/A 04/07/2018   Procedure: CYSTOSCOPY;  Surgeon:  Salvadore Dom, MD;  Location: [facility];  Service: Gynecology;  Laterality: N/A;  . LAPAROSCOPIC LYSIS OF ADHESIONS  04/07/2018   Procedure: EXTENSIVE LAPAROSCOPIC LYSIS OF ADHESIONS;  Surgeon: Salvadore Dom, MD;  Location: [facility];  Service: Gynecology;;  . TOTAL LAPAROSCOPIC HYSTERECTOMY WITH SALPINGECTOMY Bilateral 04/07/2018   Procedure: TOTAL LAPAROSCOPIC HYSTERECTOMY WITH SALPINGECTOMY;  Surgeon: Salvadore Dom, MD;  Location: [facility];  Service: Gynecology;  Laterality: Bilateral;  3 hours OR time. BMI 51.21  . WISDOM TOOTH EXTRACTION      Social History   Tobacco Use  . Smoking status: Never Smoker  . Smokeless tobacco: Never Used  Substance Use Topics  . Alcohol use: No    Alcohol/week: 0.0 standard drinks    Family History  Problem Relation Age of Onset  . Diabetes Mother   . Hypertension Mother   . Breast cancer Mother 22  . Cancer Mother   . Breast cancer Maternal Grandmother 55  . Heart disease Neg Hx   . Stroke Neg Hx     ROS Per hpi  OBJECTIVE:  Today's Vitals   01/11/20 1450  BP: 133/88  Pulse: 95  Temp: 98 F (36.7 C)  SpO2: 100%  Weight: 283 lb (128.4 kg)  Height: 5\' 2"  (1.575 m)  Body mass index is 51.76 kg/m.   Physical Exam Vitals and nursing note reviewed.  Constitutional:      Appearance: She is well-developed.  HENT:     Head: Normocephalic and atraumatic.  Eyes:     General: No scleral icterus.    Conjunctiva/sclera: Conjunctivae normal.     Pupils: Pupils are equal, round, and reactive to light.  Pulmonary:     Effort: Pulmonary effort is normal.  Chest:     Comments: Left breast sebaceous cyst resolved Right breast very small sebaceous cyst wo warmth, tenderness or erythema along 5 oclock Musculoskeletal:     Cervical back: Neck supple.  Skin:    General: Skin is warm and dry.  Neurological:     Mental Status: She is alert and oriented to person, place, and time.     No results found for this or any  previous visit (from the past 24 hour(s)).  No results found.   ASSESSMENT and PLAN  1. Elevated BP without diagnosis of hypertension BP ok off amlodipine. Cont working on [employer], discussed low salt, exercise and weight loss.  2. Sebaceous cyst of skin of breast, unspecified laterality Improved. Discussed warm compresses as needed and RTC precautions if infected.  Return in about 6 months (around 07/12/2020) for CPE.    Rutherford Guys, MD Primary Care at [place] [address], [place] [address] Ph.  [phone] Fax [phone]

## 2020-01-11 NOTE — Patient Instructions (Addendum)
On You Tube - Walk at [place]   If you have lab work done today you will be contacted with your lab results within the next 2 weeks.  If you have not heard from [place] then please contact us. The fastest way to get your results is to register for My Chart.   IF you received an x-ray today, you will receive an invoice from [facility]. Please contact [facility] at [phone] with questions or concerns regarding your invoice.   IF you received labwork today, you will receive an invoice from [place] [facility]. Please contact [facility] at [phone] with questions or concerns regarding your invoice.   Our billing staff will not be able to assist you with questions regarding bills from these companies.  You will be contacted with the lab results as soon as they are available. The fastest way to get your results is to activate your My Chart account. Instructions are located on the last page of this paperwork. If you have not heard from [place] regarding the results in 2 weeks, please contact this office.      Epidermal Cyst Removal  Epidermal cyst removal is a procedure to remove a sac of oily material (keratin) that has formed under your skin (epidermal cyst). Epidermal cysts may also be called epidermoid cysts, sebaceous cysts, or keratin cysts. Normally, the skin secretes this oily material through a gland or a hair follicle. However, when a skin gland or hair follicle becomes blocked, an epidermal cyst can form. You may need this procedure if you have an epidermal cyst that becomes large, uncomfortable, or infected. Tell a health care provider about:  Any allergies you have.  All medicines you are taking, including vitamins, herbs, eye drops, creams, and over-the-counter medicines.  Any problems you or family members have had with anesthetic medicines.  Any blood disorders you have.  Any surgeries you have had.  Any medical conditions you have now or have  had.  Whether you are pregnant or may be pregnant. What are the risks? Generally, this is a safe procedure. However, problems may occur, including:  Development of another cyst.  Bleeding.  Infection.  Scarring. What happens before the procedure?  Ask your health care provider about: ? Changing or stopping your regular medicines. This is especially important if you are taking diabetes medicines or blood thinners. ? Taking medicines such as aspirin and ibuprofen. These medicines can thin your blood. Do not take these medicines unless your health care provider tells you to take them. ? Taking over-the-counter medicines, vitamins, herbs, and supplements.  If you have an infected cyst, you may have to take antibiotic medicine before the cyst removal. Take your antibiotic as told by your health care provider. Do not stop taking the antibiotic even if you start to feel better.  Take a shower on the morning of your procedure. Your health care provider may ask you to use a germ-killing soap. What happens during the procedure?   You will be given a medicine to numb the area (local anesthetic).  The skin around the cyst will be cleaned with a germ-killing solution.  The health care provider will make a small incision in your skin over the cyst.  The health care provider will separate the cyst from the surrounding tissues that are under your skin.  If possible, the cyst will be removed undamaged (intact).  If the cyst bursts (ruptures), it will be removed in pieces.  After the cyst is removed, the  health care provider will control any bleeding and close the incision with small stitches (sutures). Small incisions may not need sutures, and the bleeding will be controlled by applying direct pressure with gauze.  The health care provider may apply antibiotic ointment and a bandage (dressing) over the incision. The procedure may vary among health care providers and hospitals. What happens  after the procedure?  If your cyst ruptured during the procedure, you may need an antibiotic. If you are prescribed an antibiotic medicine or ointment, take or apply it as told by your health care provider. Do not stop using the antibiotic even if you start to feel better. Summary  Epidermal cyst removal is a procedure to remove a sac of oily material (keratin) that has formed under your skin (epidermal cyst).  You may need this procedure if you have an epidermal cyst that becomes large, uncomfortable, or infected.  The health care provider will make a small incision in your skin to remove the cyst.  If you are prescribed an antibiotic medicine before the procedure, after the procedure, or both, use the antibiotic as told by your health care provider. Do not stop using the antibiotic even if you start to feel better. This information is not intended to replace advice given to you by your health care provider. Make sure you discuss any questions you have with your health care provider. Document Revised: 12/24/2018 Document Reviewed: 06/26/2017 Elsevier Patient Education  [place].

## 2020-01-11 NOTE — Telephone Encounter (Signed)
01/11/2020 - PATIENT SAW DR. Benay Spice IN THE OFFICE ON Tuesday (01/11/2020). SHE NEEDS TO HAVE HER COMPLETE PHYSICAL IN 6 MONTHS. HER PHYSICAL IS SCHEDULED FOR Friday (07/14/2020). THIS MESSAGE IS TO HAVE HER LAB ORDERS PLACED. HER NURSE'S APPOINTMENT IS ON Monday (07/10/2020). [place]

## 2020-01-11 NOTE — Telephone Encounter (Signed)
Labs placed for pt cpe appt

## 2020-07-10 ENCOUNTER — Ambulatory Visit: Payer: Commercial Managed Care - PPO

## 2020-07-14 ENCOUNTER — Encounter: Payer: Commercial Managed Care - PPO | Admitting: Family Medicine

## 2020-09-09 ENCOUNTER — Encounter (HOSPITAL_COMMUNITY): Payer: Self-pay

## 2020-09-09 ENCOUNTER — Emergency Department (HOSPITAL_COMMUNITY)
Admission: EM | Admit: 2020-09-09 | Discharge: 2020-09-09 | Disposition: A | Discharge status: Home / Self Care | Payer: Commercial Managed Care - PPO | Attending: Emergency Medicine | Admitting: Emergency Medicine

## 2020-09-09 ENCOUNTER — Emergency Department (HOSPITAL_COMMUNITY): Payer: Commercial Managed Care - PPO

## 2020-09-09 ENCOUNTER — Other Ambulatory Visit: Payer: Self-pay

## 2020-09-09 DIAGNOSIS — D649 Anemia, unspecified: Secondary | ICD-10-CM | POA: Diagnosis not present

## 2020-09-09 DIAGNOSIS — R791 Abnormal coagulation profile: Secondary | ICD-10-CM | POA: Insufficient documentation

## 2020-09-09 DIAGNOSIS — I10 Essential (primary) hypertension: Secondary | ICD-10-CM | POA: Insufficient documentation

## 2020-09-09 DIAGNOSIS — R0602 Shortness of breath: Secondary | ICD-10-CM | POA: Insufficient documentation

## 2020-09-09 DIAGNOSIS — Z20822 Contact with and (suspected) exposure to covid-19: Secondary | ICD-10-CM | POA: Diagnosis not present

## 2020-09-09 DIAGNOSIS — R0789 Other chest pain: Secondary | ICD-10-CM | POA: Insufficient documentation

## 2020-09-09 LAB — COMPREHENSIVE METABOLIC PANEL
ALT: 13 U/L (ref 0–44)
AST: 13 U/L — ABNORMAL LOW (ref 15–41)
Albumin: 3.6 g/dL (ref 3.5–5.0)
Alkaline Phosphatase: 50 U/L (ref 38–126)
Anion gap: 9 (ref 5–15)
BUN: 9 mg/dL (ref 6–20)
CO2: 24 mmol/L (ref 22–32)
Calcium: 8.8 mg/dL — ABNORMAL LOW (ref 8.9–10.3)
Chloride: 105 mmol/L (ref 98–111)
Creatinine, Ser: 0.69 mg/dL (ref 0.44–1.00)
GFR, Estimated: >60 mL/min (ref >60)
Glucose, Bld: 89 mg/dL (ref 70–99)
Potassium: 3.5 mmol/L (ref 3.5–5.1)
Sodium: 138 mmol/L (ref 135–145)
Total Bilirubin: 0.7 mg/dL (ref 0.3–1.2)
Total Protein: 8.1 g/dL (ref 6.5–8.1)

## 2020-09-09 LAB — CBC
HCT: 37.2 % (ref 36.0–46.0)
Hemoglobin: 11.8 g/dL — ABNORMAL LOW (ref 12.0–15.0)
MCH: 25.8 pg — ABNORMAL LOW (ref 26.0–34.0)
MCHC: 31.7 g/dL (ref 30.0–36.0)
MCV: 81.2 fL (ref 80.0–100.0)
Platelets: 385 10*3/uL (ref 150–400)
RBC: 4.58 MIL/uL (ref 3.87–5.11)
RDW: 12.7 % (ref 11.5–15.5)
WBC: 5.5 10*3/uL (ref 4.0–10.5)
nRBC: 0 % (ref 0.0–0.2)

## 2020-09-09 LAB — D-DIMER, QUANTITATIVE: D-Dimer, Quant: 0.98 ug/mL-FEU — ABNORMAL HIGH (ref 0.00–0.50)

## 2020-09-09 LAB — RESP PANEL BY RT-PCR (FLU A&B, COVID) ARPGX2
Influenza A by PCR: NEGATIVE
Influenza B by PCR: NEGATIVE
SARS Coronavirus 2 by RT PCR: NEGATIVE

## 2020-09-09 LAB — TROPONIN I (HIGH SENSITIVITY)
Troponin I (High Sensitivity): <2 ng/L (ref <18)
Troponin I (High Sensitivity): <2 ng/L (ref <18)

## 2020-09-09 LAB — BRAIN NATRIURETIC PEPTIDE: B Natriuretic Peptide: 39.5 pg/mL (ref 0.0–100.0)

## 2020-09-09 MED ORDER — ASPIRIN 81 MG PO CHEW
324.0000 mg | CHEWABLE_TABLET | Freq: Once | ORAL | Status: AC
  Administered 2020-09-09: 324 mg via ORAL
  Filled 2020-09-09: qty 4

## 2020-09-09 MED ORDER — IOHEXOL 350 MG/ML SOLN
100.0000 mL | Freq: Once | INTRAVENOUS | Status: AC | PRN
  Administered 2020-09-09: 100 mL via INTRAVENOUS

## 2020-09-09 NOTE — ED Provider Notes (Signed)
[facility] DEPT Provider Note   CSN: [id number] Arrival date & time: 09/09/20  [id number]     History Chief Complaint  Patient presents with  . Shortness of Breath  . Chest Pain    [name] is a 39 y.o. female.  The history is provided by the patient.  Shortness of Breath Severity:  Moderate Onset quality:  Gradual Duration:  12 hours Timing:  Constant Progression:  Worsening Chronicity:  New Context: activity   Context: not animal exposure, not emotional upset, not fumes, not known allergens, not occupational exposure, not pollens, not strong odors and not URI   Relieved by:  Rest Worsened by:  Deep breathing and exertion Ineffective treatments:  None tried Associated symptoms: chest pain   Associated symptoms: no abdominal pain, no claudication, no cough, no diaphoresis, no ear pain, no fever, no headaches, no hemoptysis, no neck pain, no PND, no rash, no sore throat, no sputum production, no syncope, no swollen glands, no vomiting and no wheezing   Chest pain:    Quality: aching and sharp     Severity:  Moderate   Onset quality:  Gradual   Duration:  12 hours   Timing:  Constant   Progression:  Worsening   Chronicity:  New Risk factors: obesity   Risk factors: no recent alcohol use, no family hx of DVT, no hx of cancer, no hx of PE/DVT, no oral contraceptive use, no prolonged immobilization, no recent surgery and no tobacco use   Chest Pain Associated symptoms: shortness of breath   Associated symptoms: no abdominal pain, no claudication, no cough, no diaphoresis, no fatigue, no fever, no headache, no nausea, no palpitations, no PND, no syncope and no vomiting    [name] is a 39 y/o F who presents with cc of Cp and sob.  She is status post hysterectomy.  Last night she began having some exertional dyspnea and thought it might be due to being worn out from preparing for the holidays. This morning she awoke with cp which is L  sided and central, radiates to her back, is worse with deep breathing and at time sharp. Pain is moderate.  She is not on any exogenous estrogens, denies a history of PE or DVT.  She denies orthopnea, PND, peripheral edema.  She has no history of hypertension, hyperlipidemia, smoking, family history of CAD or DVT.  The patient is fully vaccinated against the coronavirus. HPI: A 39 year old patient with a history of obesity presents for evaluation of chest pain. Initial onset of pain was less than one hour ago. The patient's chest pain is described as heaviness/pressure/tightness, is sharp and is worse with exertion. The patient's chest pain is middle- or left-sided, is not well-localized and does not radiate to the arms/jaw/neck. The patient does not complain of nausea and denies diaphoresis. The patient has no history of stroke, has no history of peripheral artery disease, has not smoked in the past 90 days, denies any history of treated diabetes, has no relevant family history of coronary artery disease (first degree relative at less than age 4), is not hypertensive and has no history of hypercholesterolemia.   Past Medical History:  Diagnosis Date  . Abnormal uterine bleeding   . Anemia   . Dysmenorrhea   . Fibroid   . Obesity   . SVD (spontaneous vaginal delivery) 2007   x x    Patient Active Problem List   Diagnosis Date Noted  . Breast mass  09/07/2019  . Dermatitis 09/07/2019  . Essential hypertension 09/07/2019  . Status post laparoscopic hysterectomy 04/07/2018  . Family history of breast cancer   . Need for Tdap vaccination 08/06/2011  . Obesity 08/06/2011    Past Surgical History:  Procedure Laterality Date  . ABDOMINAL HYSTERECTOMY    . CESAREAN SECTION  2009   x 1  . CYSTOSCOPY N/A 04/07/2018   Procedure: CYSTOSCOPY;  Surgeon: Salvadore Dom, MD;  Location: [facility];  Service: Gynecology;  Laterality: N/A;  . LAPAROSCOPIC LYSIS OF ADHESIONS  04/07/2018   Procedure:  EXTENSIVE LAPAROSCOPIC LYSIS OF ADHESIONS;  Surgeon: Salvadore Dom, MD;  Location: [facility];  Service: Gynecology;;  . TOTAL LAPAROSCOPIC HYSTERECTOMY WITH SALPINGECTOMY Bilateral 04/07/2018   Procedure: TOTAL LAPAROSCOPIC HYSTERECTOMY WITH SALPINGECTOMY;  Surgeon: Salvadore Dom, MD;  Location: [facility];  Service: Gynecology;  Laterality: Bilateral;  3 hours OR time. BMI 51.21  . WISDOM TOOTH EXTRACTION       OB History    Gravida  3   Para  2   Term  2   Preterm  0   AB  1   Living  2     SAB      IAB  1   Ectopic      Multiple      Live Births  2           Family History  Problem Relation Age of Onset  . Diabetes Mother   . Hypertension Mother   . Breast cancer Mother 59  . Cancer Mother   . Breast cancer Maternal Grandmother 66  . Heart disease Neg Hx   . Stroke Neg Hx     Social History   Tobacco Use  . Smoking status: Never Smoker  . Smokeless tobacco: Never Used  Vaping Use  . Vaping Use: Never used  Substance Use Topics  . Alcohol use: No    Alcohol/week: 0.0 standard drinks  . Drug use: No    Home Medications Prior to Admission medications   Not on File    Allergies    Patient has no known allergies.  Review of Systems   Review of Systems  Constitutional: Negative for diaphoresis, fatigue and fever.  HENT: Negative for congestion, ear pain and sore throat.   Eyes: Negative.   Respiratory: Positive for chest tightness and shortness of breath. Negative for cough, hemoptysis, sputum production and wheezing.   Cardiovascular: Positive for chest pain. Negative for palpitations, claudication, leg swelling, syncope and PND.  Gastrointestinal: Negative for abdominal pain, nausea and vomiting.  Genitourinary: Negative.   Musculoskeletal: Negative for neck pain.  Skin: Negative for rash.  Neurological: Negative for headaches.  Hematological: Negative.   Psychiatric/Behavioral: Negative for behavioral problems. The patient is not  nervous/anxious.   All other systems reviewed and are negative.   Physical Exam Updated Vital Signs BP (!) 149/93 (BP Location: Right Arm)   Pulse 70   Temp 98.4 F (36.9 C) (Oral)   Resp (!) 26   Ht 5\' 2"  (1.575 m)   Wt 124.7 kg   LMP 03/31/2018 (Exact Date)   SpO2 100%   BMI 50.30 kg/m   Physical Exam Vitals and nursing note reviewed.  Constitutional:      General: She is not in acute distress.    Appearance: She is well-developed and well-nourished. She is not diaphoretic.  HENT:     Head: Normocephalic and atraumatic.  Eyes:     General: No  scleral icterus.    Conjunctiva/sclera: Conjunctivae normal.  Cardiovascular:     Rate and Rhythm: Normal rate and regular rhythm.     Heart sounds: Normal heart sounds. No murmur heard. No friction rub. No gallop.   Pulmonary:     Effort: Pulmonary effort is normal. No respiratory distress.     Breath sounds: Normal breath sounds.  Chest:     Chest wall: No tenderness.  Abdominal:     General: Bowel sounds are normal. There is no distension.     Palpations: Abdomen is soft. There is no mass.     Tenderness: There is no abdominal tenderness. There is no guarding.  Musculoskeletal:     Cervical back: Normal range of motion.     Right lower leg: No edema.     Left lower leg: No edema.  Skin:    General: Skin is warm and dry.  Neurological:     Mental Status: She is alert and oriented to person, place, and time.  Psychiatric:        Behavior: Behavior normal.     ED Results / Procedures / Treatments   Labs (all labs ordered are listed, but only abnormal results are displayed) Labs Reviewed  CBC - Abnormal; Notable for the following components:      Result Value   Hemoglobin 11.8 (*)    MCH 25.8 (*)    All other components within normal limits  COMPREHENSIVE METABOLIC PANEL - Abnormal; Notable for the following components:   Calcium 8.8 (*)    AST 13 (*)    All other components within normal limits  D-DIMER,  QUANTITATIVE (NOT AT [facility]) - Abnormal; Notable for the following components:   D-Dimer, Quant 0.98 (*)    All other components within normal limits  RESP PANEL BY RT-PCR (FLU A&B, COVID) ARPGX2  BRAIN NATRIURETIC PEPTIDE  TROPONIN I (HIGH SENSITIVITY)  TROPONIN I (HIGH SENSITIVITY)    EKG EKG Interpretation  Date/Time:  Saturday September 09 2020 09:25:27 EST Ventricular Rate:  86 PR Interval:    QRS Duration: 78 QT Interval:  353 QTC Calculation: 423 R Axis:   -46 Text Interpretation: Sinus rhythm Left anterior fascicular block Low voltage, precordial leads Consider anterior infarct Confirmed by Lennice Sites [phone]) on 09/09/2020 10:46:03 AM   Radiology CT Angio Chest PE W/Cm &/Or Wo Cm  Result Date: 09/09/2020 CLINICAL DATA:  Shortness of breath.  Chest discomfort. EXAM: CT ANGIOGRAPHY CHEST WITH CONTRAST TECHNIQUE: Multidetector CT imaging of the chest was performed using the standard protocol during bolus administration of intravenous contrast. Multiplanar CT image reconstructions and MIPs were obtained to evaluate the vascular anatomy. CONTRAST:  177mL OMNIPAQUE IOHEXOL 350 MG/ML SOLN COMPARISON:  Plain film earlier today.  CTA of 11/25/2007. FINDINGS: Cardiovascular: The quality of this exam for evaluation of pulmonary embolism is moderate. The bolus is centered in the SVC. There is also minimal motion degradation. No pulmonary embolism to the segmental level. Normal aortic caliber. Normal heart size, without pericardial effusion. Mediastinum/Nodes: Prominent right greater than left axillary nodes are similar and presumably reactive. No mediastinal or hilar adenopathy. Lungs/Pleura: No pleural fluid.  Clear lungs. Upper Abdomen: Normal imaged portions of the liver, spleen, stomach, pancreas, adrenal glands, left kidney. Musculoskeletal: No acute osseous abnormality. Review of the MIP images confirms the above findings. IMPRESSION: 1. Moderate quality evaluation for pulmonary  embolism. No pulmonary embolism with limitations above. 2. No other explanation for patient's symptoms. Electronically Signed   By: Thaine Garriga Miyamoto  M.D.   On: 09/09/2020 12:02   DG Chest Port 1 View  Result Date: 09/09/2020 CLINICAL DATA:  Chest pain and shortness of breath beginning this morning. EXAM: PORTABLE CHEST 1 VIEW COMPARISON:  01/27/2017 FINDINGS: Lungs are adequately inflated without focal airspace consolidation or effusion. Cardiomediastinal silhouette and remainder of the exam is unchanged. IMPRESSION: No active disease. Electronically Signed   By: Marin Olp M.D.   On: 09/09/2020 10:19    Procedures Procedures (including critical care time)  Medications Ordered in ED Medications  aspirin chewable tablet 324 mg (324 mg Oral Given 09/09/20 1002)  iohexol (OMNIPAQUE) 350 MG/ML injection 100 mL (100 mLs Intravenous Contrast Given 09/09/20 1143)    ED Course  I have reviewed the triage vital signs and the nursing notes.  Pertinent labs & imaging results that were available during my care of the patient were reviewed by me and considered in my medical decision making (see chart for details).    MDM Rules/Calculators/A&P HEAR Score: 3                        Given the large differential diagnosis for [name], the decision making in this case is of high complexity.  After evaluating all of the data points in this case, the presentation of [name] is NOT consistent with Acute Coronary Syndrome (ACS) and/or myocardial ischemia, pulmonary embolism, aortic dissection; [name], significant arrythmia, pneumothorax, cardiac tamponade, or other emergent cardiopulmonary condition.  Further, the presentation of [name] is NOT consistent with pericarditis, myocarditis, cholecystitis, pancreatitis, mediastinitis, endocarditis, new valvular disease.  Additionally, the presentation of [name] NOT consistent with flail chest, cardiac contusion, ARDS, or  significant intra-thoracic or intra-abdominal bleeding.  Moreover, this presentation is NOT consistent with pneumonia, sepsis, or pyelonephritis.  The patient has had an evaluation which includes the following labs: CBC which shows mild normocytic anemia, CMP with mildly low calcium with insignificant value, D-dimer is elevated, BNP within normal limits, troponin is negative x2, respiratory panel is negative for influenza a and B as well as coronavirus. EKG shows sinus rhythm at a rate of 86 with T wave abnormalities but no signs of acute infarct or ischemia.  I ordered and reviewed images that included CT portable 1 view chest x-ray which is negative for acute abnormality and CT angiogram of the chest which shows no evidence of PE or pneumonia.  Patient ambulated in the emergency department with oxygen saturations remaining at 100%.    Strict return and follow-up precautions have been given by me personally or by detailed written instruction given verbally by nursing staff using the teach back method to the patient/family/caregiver(s).  Data Reviewed/Counseling: I have reviewed the patient's vital signs, nursing notes, and other relevant tests/information. I had a detailed discussion regarding the historical points, exam findings, and any diagnostic results supporting the discharge diagnosis. I also discussed the need for outpatient follow-up and the need to return to the ED if symptoms worsen or if there are any questions or concerns that arise at home.   [name] was evaluated in Emergency Department on 09/09/2020 for the symptoms described in the history of present illness. She was evaluated in the context of the global COVID-19 pandemic, which necessitated consideration that the patient might be at risk for infection with the SARS-CoV-2 virus that causes COVID-19. Institutional protocols and algorithms that pertain to the evaluation of patients at risk for COVID-19 are in a state  of  rapid change based on information released by regulatory bodies including the CDC and federal and state organizations. These policies and algorithms were followed during the patient's care in the ED.  Final Clinical Impression(s) / ED Diagnoses Final diagnoses:  Atypical chest pain  Shortness of breath    Rx / DC Orders ED Discharge Orders    None       Margarita Mail, PA-C 09/09/20 1312    Lennice Sites, DO 09/09/20 1548

## 2020-09-09 NOTE — Discharge Instructions (Addendum)
All of your labs, EKG,  and imaging are negative. You will need to follow up in the next 7- 10 days or sooner with your primary care doctor. Your caregiver has diagnosed you as having chest pain that is not specific for one problem, but does not require admission.  You are at low risk for an acute heart condition or other serious illness. Chest pain comes from many different causes.  SEEK IMMEDIATE MEDICAL ATTENTION IF: You have severe chest pain, especially if the pain is crushing or pressure-like and spreads to the arms, back, neck, or jaw, or if you have sweating, nausea (feeling sick to your stomach), or shortness of breath. THIS IS AN EMERGENCY. Don't wait to see if the pain will go away. Get medical help at once. Call 911 or 0 (operator). DO NOT drive yourself to the hospital.  Your chest pain gets worse and does not go away with rest.  You have an attack of chest pain lasting longer than usual, despite rest and treatment with the medications your caregiver has prescribed.  You wake from sleep with chest pain or shortness of breath.  You feel dizzy or faint.  You have chest pain not typical of your usual pain for which you originally saw your caregiver.

## 2020-09-09 NOTE — ED Notes (Signed)
Patient ambulated next to bed for 80 seconds, during that time her O2 saturation stayed at 100%.

## 2020-09-09 NOTE — ED Triage Notes (Signed)
Patient c/o SOB, squeezing of chest and back since 0700 today.

## 2020-09-18 ENCOUNTER — Ambulatory Visit (INDEPENDENT_AMBULATORY_CARE_PROVIDER_SITE_OTHER): Payer: Commercial Managed Care - PPO | Admitting: Family Medicine

## 2020-09-18 ENCOUNTER — Encounter: Payer: Self-pay | Admitting: Family Medicine

## 2020-09-18 ENCOUNTER — Other Ambulatory Visit: Payer: Self-pay

## 2020-09-18 VITALS — BP 146/82 | HR 79 | Temp 97.9°F | Ht 62.0 in | Wt 281.0 lb

## 2020-09-18 DIAGNOSIS — I10 Essential (primary) hypertension: Secondary | ICD-10-CM

## 2020-09-18 DIAGNOSIS — R5383 Other fatigue: Secondary | ICD-10-CM

## 2020-09-18 DIAGNOSIS — Z114 Encounter for screening for human immunodeficiency virus [HIV]: Secondary | ICD-10-CM

## 2020-09-18 DIAGNOSIS — R0602 Shortness of breath: Secondary | ICD-10-CM | POA: Diagnosis not present

## 2020-09-18 DIAGNOSIS — K219 Gastro-esophageal reflux disease without esophagitis: Secondary | ICD-10-CM

## 2020-09-18 DIAGNOSIS — Z131 Encounter for screening for diabetes mellitus: Secondary | ICD-10-CM | POA: Diagnosis not present

## 2020-09-18 DIAGNOSIS — Z1159 Encounter for screening for other viral diseases: Secondary | ICD-10-CM

## 2020-09-18 DIAGNOSIS — K5909 Other constipation: Secondary | ICD-10-CM

## 2020-09-18 MED ORDER — PANTOPRAZOLE SODIUM 40 MG PO TBEC: 40.0000 mg | DELAYED_RELEASE_TABLET | Freq: Every day | ORAL | 0 refills | Status: DC

## 2020-09-18 MED ORDER — POLYETHYLENE GLYCOL 3350 17 GM/SCOOP PO POWD: 17.0000 g | Freq: Two times a day (BID) | ORAL | 1 refills | Status: DC | PRN

## 2020-09-18 NOTE — Patient Instructions (Addendum)
    Take the protonix (pantoprazole) daily for 1 month At the end of the month try over the counter ranitidine/zantac as needed If not improved message me on my chart  Take the miralax up to two times a day for one stool a day  If your taking your BP at home and noticing its greater than 130/80 let me know   If you have lab work done today you will be contacted with your lab results within the next 2 weeks.  If you have not heard from [place] then please contact us. The fastest way to get your results is to register for My Chart.   IF you received an x-ray today, you will receive an invoice from [facility]. Please contact [facility] at [phone] with questions or concerns regarding your invoice.   IF you received labwork today, you will receive an invoice from [facility]. Please contact [facility] at [phone] with questions or concerns regarding your invoice.   Our billing staff will not be able to assist you with questions regarding bills from these companies.  You will be contacted with the lab results as soon as they are available. The fastest way to get your results is to activate your My Chart account. Instructions are located on the last page of this paperwork. If you have not heard from [place] regarding the results in 2 weeks, please contact this office.

## 2020-09-18 NOTE — Progress Notes (Signed)
1/3/202210:14 AM  [name] Jul 26, 1981, 40 y.o., female [id number]  Chief Complaint  Patient presents with  . ER follow up     Chest pain and shortness of breath at times with activity, weakness     HPI:   Patient is a 40 y.o. female with past medical history significant for obesity, and family history of breast ca who presents today for ED follow up.  Was having SOB and back pain on 12/25 Still feeling weak since  Constipation Taking 2 stool softener daily Had been 9-10 days prior with no stool  Checks BP at home 122/80  BP Readings from Last 3 Encounters:  09/18/20 (!) 146/82  09/09/20 126/87  01/11/20 133/88     Depression screen PHQ 2/9 09/18/2020 01/11/2020 11/23/2019  Decreased Interest 0 0 0  Down, Depressed, Hopeless 0 0 0  PHQ - 2 Score 0 0 0    Fall Risk  09/18/2020 01/11/2020 11/23/2019 09/07/2019  Falls in the past year? 0 0 0 0  Number falls in past yr: 0 0 0 0  Injury with Fall? 0 0 0 0  Follow up Falls evaluation completed Falls evaluation completed Falls evaluation completed Falls evaluation completed     No Known Allergies  Prior to Admission medications   Not on File    Past Medical History:  Diagnosis Date  . Abnormal uterine bleeding   . Anemia   . Dysmenorrhea   . Fibroid   . Obesity   . SVD (spontaneous vaginal delivery) 2007   x x    Past Surgical History:  Procedure Laterality Date  . ABDOMINAL HYSTERECTOMY    . CESAREAN SECTION  2009   x 1  . CYSTOSCOPY N/A 04/07/2018   Procedure: CYSTOSCOPY;  Surgeon: Salvadore Dom, MD;  Location: [facility];  Service: Gynecology;  Laterality: N/A;  . LAPAROSCOPIC LYSIS OF ADHESIONS  04/07/2018   Procedure: EXTENSIVE LAPAROSCOPIC LYSIS OF ADHESIONS;  Surgeon: Salvadore Dom, MD;  Location: [facility];  Service: Gynecology;;  . TOTAL LAPAROSCOPIC HYSTERECTOMY WITH SALPINGECTOMY Bilateral 04/07/2018   Procedure: TOTAL LAPAROSCOPIC HYSTERECTOMY WITH SALPINGECTOMY;  Surgeon: Salvadore Dom, MD;  Location: [facility];  Service: Gynecology;  Laterality: Bilateral;  3 hours OR time. BMI 51.21  . WISDOM TOOTH EXTRACTION      Social History   Tobacco Use  . Smoking status: Never Smoker  . Smokeless tobacco: Never Used  Substance Use Topics  . Alcohol use: No    Alcohol/week: 0.0 standard drinks    Family History  Problem Relation Age of Onset  . Diabetes Mother   . Hypertension Mother   . Breast cancer Mother 94  . Cancer Mother   . Breast cancer Maternal Grandmother 44  . Heart disease Neg Hx   . Stroke Neg Hx     Review of Systems  Constitutional: Negative for chills, fever and malaise/fatigue.  Eyes: Negative for blurred vision and double vision.  Respiratory: Positive for shortness of breath (not at this time, previously at ED). Negative for cough, hemoptysis, sputum production and wheezing.   Cardiovascular: Positive for chest pain (not at this time, previously at ED). Negative for palpitations and leg swelling.  Gastrointestinal: Positive for constipation (Takes OTC stool softener) and heartburn (has not taken any medication for this, worse thru holidays). Negative for abdominal pain, blood in stool, diarrhea, nausea and vomiting.  Genitourinary: Negative for dysuria, frequency and hematuria.  Musculoskeletal: Negative for back pain and joint pain.  Skin:  Negative for rash.  Neurological: Negative for dizziness, weakness and headaches.     OBJECTIVE:  Today's Vitals   09/18/20 0912 09/18/20 0938  BP: (!) 157/93 (!) 146/82  Pulse: 79   Temp: 97.9 F (36.6 C)   SpO2: 100%   Weight: 281 lb (127.5 kg)   Height: $Remove'5\' 2"'LVCedQc$  (1.575 m)    Body mass index is 51.4 kg/m.   Physical Exam Constitutional:      General: She is not in acute distress.    Appearance: Normal appearance. She is not ill-appearing.  HENT:     Head: Normocephalic.  Cardiovascular:     Rate and Rhythm: Normal rate and regular rhythm.     Pulses: Normal pulses.     Heart sounds:  Normal heart sounds. No murmur heard. No friction rub. No gallop.   Pulmonary:     Effort: Pulmonary effort is normal. No respiratory distress.     Breath sounds: Normal breath sounds. No stridor. No wheezing, rhonchi or rales.  Abdominal:     General: Bowel sounds are normal.     Palpations: Abdomen is soft.     Tenderness: There is no abdominal tenderness.  Musculoskeletal:     Right lower leg: No edema.     Left lower leg: No edema.  Skin:    General: Skin is warm and dry.  Neurological:     Mental Status: She is alert and oriented to person, place, and time.  Psychiatric:        Mood and Affect: Mood normal.        Behavior: Behavior normal.     No results found for this or any previous visit (from the past 24 hour(s)).  No results found.   ASSESSMENT and PLAN  Problem List Items Addressed This Visit      Cardiovascular and Mediastinum   Essential hypertension   Relevant Orders   CMP14+EGFR   Lipid Panel  Encouraged to continue to monitor home BP for < 130/80    Other Visit Diagnoses    Fatigue, unspecified type    -  Primary   Relevant Orders   CBC with Differential   Iron, TIBC and Ferritin Panel   Vitamin D, 25-hydroxy   TSH   Vitamin B12 Will follow up with lab results   Shortness of breath       Relevant Orders   D-dimer, quantitative (not at [facility]) Lab Results  Component Value Date   DDIMER 0.98 (H) 09/09/2020      Screening for diabetes mellitus       Relevant Orders   Hemoglobin A1c   Encounter for screening for HIV       Relevant Orders   HIV Antibody (routine testing w rflx)   Encounter for hepatitis C screening test for low risk patient       Relevant Orders   Hepatitis C antibody   Gastroesophageal reflux disease without esophagitis       Relevant Medications   pantoprazole (PROTONIX) 40 MG tablet   polyethylene glycol powder (GLYCOLAX/MIRALAX) 17 GM/SCOOP powder  Protonix daily for 1 month than transition to OTC H2 blocker prn    Other constipation       Relevant Medications   polyethylene glycol powder (GLYCOLAX/MIRALAX) 17 GM/SCOOP powder  Continue with daily stool softener       Return in about 3 months (around 12/17/2020).    Huston Foley Robbyn Hodkinson, FNP-BC [facility] [place], [place] [address] Ph.  [phone]  Fax [phone]

## 2020-09-19 ENCOUNTER — Other Ambulatory Visit: Payer: Self-pay | Admitting: Family Medicine

## 2020-09-19 DIAGNOSIS — E559 Vitamin D deficiency, unspecified: Secondary | ICD-10-CM

## 2020-09-19 DIAGNOSIS — E7849 Other hyperlipidemia: Secondary | ICD-10-CM

## 2020-09-19 LAB — CMP14+EGFR
ALT: 11 IU/L (ref 0–32)
AST: 11 IU/L (ref 0–40)
Albumin/Globulin Ratio: 1.1 — ABNORMAL LOW (ref 1.2–2.2)
Albumin: 4.2 g/dL (ref 3.8–4.8)
Alkaline Phosphatase: 68 IU/L (ref 44–121)
BUN/Creatinine Ratio: 10 (ref 9–23)
BUN: 8 mg/dL (ref 6–24)
Bilirubin Total: 0.4 mg/dL (ref 0.0–1.2)
CO2: 22 mmol/L (ref 20–29)
Calcium: 9.7 mg/dL (ref 8.7–10.2)
Chloride: 100 mmol/L (ref 96–106)
Creatinine, Ser: 0.78 mg/dL (ref 0.57–1.00)
GFR calc Af Amer: 110 mL/min/{1.73_m2} (ref >59)
GFR calc non Af Amer: 95 mL/min/{1.73_m2} (ref >59)
Globulin, Total: 3.7 g/dL (ref 1.5–4.5)
Glucose: 84 mg/dL (ref 65–99)
Potassium: 4.1 mmol/L (ref 3.5–5.2)
Sodium: 135 mmol/L (ref 134–144)
Total Protein: 7.9 g/dL (ref 6.0–8.5)

## 2020-09-19 LAB — CBC WITH DIFFERENTIAL/PLATELET
Basophils Absolute: 0 10*3/uL (ref 0.0–0.2)
Basos: 1 %
EOS (ABSOLUTE): 0.2 10*3/uL (ref 0.0–0.4)
Eos: 4 %
Hematocrit: 39.4 % (ref 34.0–46.6)
Hemoglobin: 12.5 g/dL (ref 11.1–15.9)
Immature Grans (Abs): 0 10*3/uL (ref 0.0–0.1)
Immature Granulocytes: 0 %
Lymphocytes Absolute: 1.6 10*3/uL (ref 0.7–3.1)
Lymphs: 35 %
MCH: 25.7 pg — ABNORMAL LOW (ref 26.6–33.0)
MCHC: 31.7 g/dL (ref 31.5–35.7)
MCV: 81 fL (ref 79–97)
Monocytes Absolute: 0.4 10*3/uL (ref 0.1–0.9)
Monocytes: 8 %
Neutrophils Absolute: 2.4 10*3/uL (ref 1.4–7.0)
Neutrophils: 52 %
Platelets: 493 10*3/uL — ABNORMAL HIGH (ref 150–450)
RBC: 4.87 x10E6/uL (ref 3.77–5.28)
RDW: 13.1 % (ref 11.7–15.4)
WBC: 4.5 10*3/uL (ref 3.4–10.8)

## 2020-09-19 LAB — HEPATITIS C ANTIBODY: Hep C Virus Ab: <0.1 s/co ratio (ref 0.0–0.9)

## 2020-09-19 LAB — TSH: TSH: 2.19 u[IU]/mL (ref 0.450–4.500)

## 2020-09-19 LAB — VITAMIN B12: Vitamin B-12: 504 pg/mL (ref 232–1245)

## 2020-09-19 LAB — IRON,TIBC AND FERRITIN PANEL
Ferritin: 110 ng/mL (ref 15–150)
Iron Saturation: 19 % (ref 15–55)
Iron: 63 ug/dL (ref 27–159)
Total Iron Binding Capacity: 339 ug/dL (ref 250–450)
UIBC: 276 ug/dL (ref 131–425)

## 2020-09-19 LAB — LIPID PANEL
Chol/HDL Ratio: 3.8 ratio (ref 0.0–4.4)
Cholesterol, Total: 222 mg/dL — ABNORMAL HIGH (ref 100–199)
HDL: 58 mg/dL (ref >39)
LDL Chol Calc (NIH): 153 mg/dL — ABNORMAL HIGH (ref 0–99)
Triglycerides: 63 mg/dL (ref 0–149)
VLDL Cholesterol Cal: 11 mg/dL (ref 5–40)

## 2020-09-19 LAB — D-DIMER, QUANTITATIVE: D-DIMER: 1.25 mg/L FEU — ABNORMAL HIGH (ref 0.00–0.49)

## 2020-09-19 LAB — HIV ANTIBODY (ROUTINE TESTING W REFLEX): HIV Screen 4th Generation wRfx: NONREACTIVE

## 2020-09-19 LAB — HEMOGLOBIN A1C
Est. average glucose Bld gHb Est-mCnc: 100 mg/dL
Hgb A1c MFr Bld: 5.1 % (ref 4.8–5.6)

## 2020-09-19 LAB — VITAMIN D 25 HYDROXY (VIT D DEFICIENCY, FRACTURES): Vit D, 25-Hydroxy: 12.2 ng/mL — ABNORMAL LOW (ref 30.0–100.0)

## 2020-09-19 MED ORDER — VITAMIN D (ERGOCALCIFEROL) 1.25 MG (50000 UNIT) PO CAPS: 50000.0000 [IU] | ORAL_CAPSULE | ORAL | 6 refills | Status: DC

## 2020-09-19 MED ORDER — ROSUVASTATIN CALCIUM 10 MG PO TABS: 10.0000 mg | ORAL_TABLET | Freq: Every day | ORAL | 3 refills | Status: DC

## 2020-10-09 ENCOUNTER — Encounter: Payer: Self-pay | Admitting: Family Medicine

## 2020-10-09 ENCOUNTER — Other Ambulatory Visit: Payer: Self-pay | Admitting: Family Medicine

## 2020-10-09 DIAGNOSIS — R5383 Other fatigue: Secondary | ICD-10-CM

## 2020-10-10 ENCOUNTER — Other Ambulatory Visit: Payer: Self-pay | Admitting: Family Medicine

## 2020-10-10 DIAGNOSIS — K219 Gastro-esophageal reflux disease without esophagitis: Secondary | ICD-10-CM

## 2020-10-24 ENCOUNTER — Other Ambulatory Visit: Payer: Self-pay | Admitting: Family Medicine

## 2020-10-24 DIAGNOSIS — K219 Gastro-esophageal reflux disease without esophagitis: Secondary | ICD-10-CM

## 2020-10-26 ENCOUNTER — Other Ambulatory Visit: Payer: Self-pay

## 2020-10-26 ENCOUNTER — Ambulatory Visit (HOSPITAL_COMMUNITY): Payer: Commercial Managed Care - PPO | Attending: Cardiology

## 2020-10-26 DIAGNOSIS — R5383 Other fatigue: Secondary | ICD-10-CM | POA: Insufficient documentation

## 2020-10-26 LAB — ECHOCARDIOGRAM COMPLETE
Area-P 1/2: 4.21 cm2
S' Lateral: 2.3 cm

## 2020-10-26 MED ORDER — PERFLUTREN LIPID MICROSPHERE
1.0000 mL | INTRAVENOUS | Status: AC | PRN
  Administered 2020-10-26: 3 mL via INTRAVENOUS

## 2020-12-18 ENCOUNTER — Encounter: Payer: Commercial Managed Care - PPO | Admitting: Family Medicine

## 2021-02-05 ENCOUNTER — Ambulatory Visit (INDEPENDENT_AMBULATORY_CARE_PROVIDER_SITE_OTHER): Payer: Managed Care, Other (non HMO)

## 2021-02-05 ENCOUNTER — Ambulatory Visit (HOSPITAL_COMMUNITY)
Admission: EM | Admit: 2021-02-05 | Discharge: 2021-02-05 | Disposition: A | Discharge status: Home / Self Care | Payer: Managed Care, Other (non HMO) | Attending: Emergency Medicine | Admitting: Emergency Medicine

## 2021-02-05 ENCOUNTER — Encounter (HOSPITAL_COMMUNITY): Payer: Self-pay

## 2021-02-05 DIAGNOSIS — U071 COVID-19: Secondary | ICD-10-CM

## 2021-02-05 DIAGNOSIS — R0602 Shortness of breath: Secondary | ICD-10-CM

## 2021-02-05 DIAGNOSIS — Z8616 Personal history of COVID-19: Secondary | ICD-10-CM | POA: Diagnosis not present

## 2021-02-05 DIAGNOSIS — J069 Acute upper respiratory infection, unspecified: Secondary | ICD-10-CM | POA: Diagnosis present

## 2021-02-05 MED ORDER — ACETAMINOPHEN 325 MG PO TABS
975.0000 mg | ORAL_TABLET | Freq: Once | ORAL | Status: AC
  Administered 2021-02-05: 975 mg via ORAL

## 2021-02-05 MED ORDER — PREDNISONE 10 MG PO TABS: 20.0000 mg | ORAL_TABLET | Freq: Every day | ORAL | 0 refills | Status: DC

## 2021-02-05 MED ORDER — ALBUTEROL SULFATE HFA 108 (90 BASE) MCG/ACT IN AERS: 2.0000 | INHALATION_SPRAY | RESPIRATORY_TRACT | 0 refills | Status: DC | PRN

## 2021-02-05 MED ORDER — BENZONATATE 100 MG PO CAPS: 100.0000 mg | ORAL_CAPSULE | Freq: Three times a day (TID) | ORAL | 0 refills | Status: DC

## 2021-02-05 MED ORDER — ACETAMINOPHEN 325 MG PO TABS
ORAL_TABLET | ORAL | Status: AC
  Filled 2021-02-05: qty 3

## 2021-02-05 NOTE — Discharge Instructions (Signed)
Rest, push fluids, take over-the-counter meds for symptom management(Tylenol ibuprofen Chloraseptic, etc.).  Follow-up with PCP your chest x-ray was negative today for pneumonia.  If you develop chest pain shortness of breath follow-up with emergency room for additional evaluation

## 2021-02-05 NOTE — ED Triage Notes (Signed)
Pt c/o cough and SOB x 2 weeks. She states she tested positive for COVID on 01/27/2021 and states her sxs have not improved.

## 2021-02-05 NOTE — ED Provider Notes (Signed)
[facility]    CSN: [id number] Arrival date & time: 02/05/21  1239      History   Chief Complaint Chief Complaint  Patient presents with  . Shortness of Breath  . Cough    HPI [name] is a 41 y.o. female.   39 year old female patient presents to urgent care with chief complaint of shortness of breath and cough x2 weeks.  Patient was diagnosed with COVID 2 weeks prior states she is still coughing, pain with cough.  No treatment tried.  Patient is eating and drinking well voiding well.  The history is provided by the patient. No language interpreter was used.    Past Medical History:  Diagnosis Date  . Abnormal uterine bleeding   . Anemia   . Dysmenorrhea   . Fibroid   . Obesity   . SVD (spontaneous vaginal delivery) 2007   x x    Patient Active Problem List   Diagnosis Date Noted  . Viral URI with cough 02/05/2021  . History of COVID-19 02/05/2021  . Breast mass 09/07/2019  . Dermatitis 09/07/2019  . Essential hypertension 09/07/2019  . Status post laparoscopic hysterectomy 04/07/2018  . Family history of breast cancer   . Need for Tdap vaccination 08/06/2011  . Obesity 08/06/2011    Past Surgical History:  Procedure Laterality Date  . ABDOMINAL HYSTERECTOMY    . CESAREAN SECTION  2009   x 1  . CYSTOSCOPY N/A 04/07/2018   Procedure: CYSTOSCOPY;  Surgeon: Salvadore Dom, MD;  Location: [facility];  Service: Gynecology;  Laterality: N/A;  . LAPAROSCOPIC LYSIS OF ADHESIONS  04/07/2018   Procedure: EXTENSIVE LAPAROSCOPIC LYSIS OF ADHESIONS;  Surgeon: Salvadore Dom, MD;  Location: [facility];  Service: Gynecology;;  . TOTAL LAPAROSCOPIC HYSTERECTOMY WITH SALPINGECTOMY Bilateral 04/07/2018   Procedure: TOTAL LAPAROSCOPIC HYSTERECTOMY WITH SALPINGECTOMY;  Surgeon: Salvadore Dom, MD;  Location: [facility];  Service: Gynecology;  Laterality: Bilateral;  3 hours OR time. BMI 51.21  . WISDOM TOOTH EXTRACTION      OB History    Gravida  3    Para  2   Term  2   Preterm  0   AB  1   Living  2     SAB      IAB  1   Ectopic      Multiple      Live Births  2            Home Medications    Prior to Admission medications   Medication Sig Start Date End Date Taking? Authorizing Provider  albuterol (VENTOLIN HFA) 108 (90 Base) MCG/ACT inhaler Inhale 2 puffs into the lungs every 4 (four) hours as needed for wheezing or shortness of breath. 11/12/31  Yes Yahshua Thibault, Jeanett Schlein, NP  benzonatate (TESSALON) 100 MG capsule Take 1 capsule (100 mg total) by mouth every 8 (eight) hours. 3/54/56  Yes Karman Veney, Jeanett Schlein, NP  predniSONE (DELTASONE) 10 MG tablet Take 2 tablets (20 mg total) by mouth daily with breakfast. X 5 days, Disp # 10, no refills 2/56/38  Yes Amanii Snethen, Jeanett Schlein, NP  Vitamin D, Ergocalciferol, (DRISDOL) 1.25 MG (50000 UNIT) CAPS capsule Take 1 capsule (50,000 Units total) by mouth every 7 (seven) days. 09/19/20  Yes Just, Laurita Quint, FNP  pantoprazole (PROTONIX) 40 MG tablet TAKE 1 TABLET BY MOUTH EVERY DAY 10/24/20   Just, Laurita Quint, FNP  polyethylene glycol powder (GLYCOLAX/MIRALAX) 17 GM/SCOOP powder Take 17 g by mouth 2 (two)  times daily as needed. 09/18/20   Just, Laurita Quint, FNP  rosuvastatin (CRESTOR) 10 MG tablet Take 1 tablet (10 mg total) by mouth daily. 09/19/20   Just, Laurita Quint, FNP    Family History Family History  Problem Relation Age of Onset  . Diabetes Mother   . Hypertension Mother   . Breast cancer Mother 66  . Cancer Mother   . Breast cancer Maternal Grandmother 68  . Heart disease Neg Hx   . Stroke Neg Hx     Social History Social History   Tobacco Use  . Smoking status: Never Smoker  . Smokeless tobacco: Never Used  Vaping Use  . Vaping Use: Never used  Substance Use Topics  . Alcohol use: No    Alcohol/week: 0.0 standard drinks  . Drug use: No     Allergies   Patient has no known allergies.   Review of Systems Review of Systems  Constitutional: Negative for fever.   Respiratory: Positive for cough and shortness of breath. Negative for wheezing.   Cardiovascular: Negative for chest pain and palpitations.  All other systems reviewed and are negative.    Physical Exam Triage Vital Signs ED Triage Vitals  Enc Vitals Group     BP 02/05/21 1342 (!) 153/80     Pulse Rate 02/05/21 1342 (!) 110     Resp 02/05/21 1342 18     Temp 02/05/21 1342 97.9 F (36.6 C)     Temp Source 02/05/21 1342 Oral     SpO2 02/05/21 1342 100 %     Weight --      Height --      Head Circumference --      Peak Flow --      Pain Score 02/05/21 1339 0     Pain Loc --      Pain Edu? --      Excl. in [place]? --    No data found.  Updated Vital Signs BP 117/69 (BP Location: Left Arm)   Pulse 100   Temp 97.9 F (36.6 C) (Oral)   Resp 18   LMP 03/31/2018 (Exact Date)   SpO2 100%   Visual Acuity Right Eye Distance:   Left Eye Distance:   Bilateral Distance:    Right Eye Near:   Left Eye Near:    Bilateral Near:     Physical Exam Vitals and nursing note reviewed.  Constitutional:      General: She is not in acute distress.    Appearance: She is well-developed.  HENT:     Head: Normocephalic.  Eyes:     General: Lids are normal.     Conjunctiva/sclera: Conjunctivae normal.     Pupils: Pupils are equal, round, and reactive to light.  Neck:     Trachea: No tracheal deviation.  Cardiovascular:     Rate and Rhythm: Normal rate and regular rhythm.     Pulses: Normal pulses.     Heart sounds: Normal heart sounds. No murmur heard.   Pulmonary:     Effort: Pulmonary effort is normal.     Breath sounds: Normal breath sounds and air entry.  Abdominal:     General: Bowel sounds are normal.     Palpations: Abdomen is soft.     Tenderness: There is no abdominal tenderness.  Musculoskeletal:        General: Normal range of motion.     Cervical back: Normal range of motion.  Lymphadenopathy:     Cervical:  No cervical adenopathy.  Skin:    General: Skin is warm  and dry.     Findings: No rash.  Neurological:     Mental Status: She is alert and oriented to person, place, and time.     GCS: GCS eye subscore is 4. GCS verbal subscore is 5. GCS motor subscore is 6.  Psychiatric:        Speech: Speech normal.        Behavior: Behavior normal. Behavior is cooperative.      UC Treatments / Results  Labs (all labs ordered are listed, but only abnormal results are displayed) Labs Reviewed - No data to display  EKG   Radiology DG Chest 2 View  Result Date: 02/05/2021 CLINICAL DATA:  Shortness of breath, COVID positive. EXAM: CHEST - 2 VIEW COMPARISON:  September 09, 2020. FINDINGS: Trachea midline. Cardiomediastinal contours and hilar structures are normal. Lungs are clear. No sign of pleural effusion. On limited assessment no acute skeletal process. IMPRESSION: No acute cardiopulmonary disease. Electronically Signed   By: Zetta Bills M.D.   On: 02/05/2021 14:32    Procedures Procedures (including critical care time)  Medications Ordered in UC Medications  acetaminophen (TYLENOL) tablet 975 mg (975 mg Oral Given 02/05/21 1455)    Initial Impression / Assessment and Plan / UC Course  I have reviewed the triage vital signs and the nursing notes.  Pertinent labs & imaging results that were available during my care of the patient were reviewed by me and considered in my medical decision making (see chart for details).     Patient improved after Tylenol, p.o. fluids.  Encourage patient to continue pushing fluids alternate Tylenol ibuprofen.  Scripted prednisone Tessalon and albuterol, if no improvement follow-up with ER for additional testing. Final Clinical Impressions(s) / UC Diagnoses   Final diagnoses:  Viral URI with cough  History of COVID-19     Discharge Instructions     Rest, push fluids, take over-the-counter meds for symptom management(Tylenol ibuprofen Chloraseptic, etc.).  Follow-up with PCP your chest x-ray was negative  today for pneumonia.  If you develop chest pain shortness of breath follow-up with emergency room for additional evaluation    ED Prescriptions    Medication Sig Dispense Auth. Provider   benzonatate (TESSALON) 100 MG capsule Take 1 capsule (100 mg total) by mouth every 8 (eight) hours. 21 capsule Taylia Berber, NP   albuterol (VENTOLIN HFA) 108 (90 Base) MCG/ACT inhaler Inhale 2 puffs into the lungs every 4 (four) hours as needed for wheezing or shortness of breath. 18 g Jayce Boyko, NP   predniSONE (DELTASONE) 10 MG tablet Take 2 tablets (20 mg total) by mouth daily with breakfast. X 5 days, Disp # 10, no refills 10 tablet Andreyah Natividad, Jeanett Schlein, NP     PDMP not reviewed this encounter.   Tori Milks, NP 96/78/93 1746

## 2022-05-09 ENCOUNTER — Other Ambulatory Visit (HOSPITAL_BASED_OUTPATIENT_CLINIC_OR_DEPARTMENT_OTHER): Payer: Self-pay

## 2022-05-10 ENCOUNTER — Other Ambulatory Visit (HOSPITAL_BASED_OUTPATIENT_CLINIC_OR_DEPARTMENT_OTHER): Payer: Self-pay

## 2022-05-10 MED ORDER — SEMAGLUTIDE-WEIGHT MANAGEMENT 0.25 MG/0.5ML ~~LOC~~ SOAJ
0.2500 mg | SUBCUTANEOUS | 1 refills | Status: DC
  Filled 2022-05-10: qty 2, 28d supply, fill #0
  Filled 2022-06-05: qty 2, 28d supply, fill #1

## 2022-05-14 ENCOUNTER — Other Ambulatory Visit (HOSPITAL_BASED_OUTPATIENT_CLINIC_OR_DEPARTMENT_OTHER): Payer: Self-pay

## 2022-05-15 ENCOUNTER — Other Ambulatory Visit (HOSPITAL_BASED_OUTPATIENT_CLINIC_OR_DEPARTMENT_OTHER): Payer: Self-pay

## 2022-06-03 ENCOUNTER — Other Ambulatory Visit (HOSPITAL_BASED_OUTPATIENT_CLINIC_OR_DEPARTMENT_OTHER): Payer: Self-pay

## 2022-06-03 DIAGNOSIS — E559 Vitamin D deficiency, unspecified: Secondary | ICD-10-CM | POA: Diagnosis not present

## 2022-06-03 MED ORDER — WEGOVY 0.5 MG/0.5ML ~~LOC~~ SOAJ
0.5000 mg | SUBCUTANEOUS | 1 refills | Status: DC
  Filled 2022-06-03: qty 2, 28d supply, fill #0
  Filled 2022-07-13 – 2022-09-22 (×2): qty 2, 28d supply, fill #1

## 2022-06-04 ENCOUNTER — Other Ambulatory Visit (HOSPITAL_BASED_OUTPATIENT_CLINIC_OR_DEPARTMENT_OTHER): Payer: Self-pay

## 2022-06-05 ENCOUNTER — Other Ambulatory Visit (HOSPITAL_BASED_OUTPATIENT_CLINIC_OR_DEPARTMENT_OTHER): Payer: Self-pay

## 2022-06-07 ENCOUNTER — Other Ambulatory Visit (HOSPITAL_BASED_OUTPATIENT_CLINIC_OR_DEPARTMENT_OTHER): Payer: Self-pay

## 2022-06-11 ENCOUNTER — Other Ambulatory Visit (HOSPITAL_BASED_OUTPATIENT_CLINIC_OR_DEPARTMENT_OTHER): Payer: Self-pay

## 2022-06-18 ENCOUNTER — Other Ambulatory Visit (HOSPITAL_BASED_OUTPATIENT_CLINIC_OR_DEPARTMENT_OTHER): Payer: Self-pay

## 2022-06-21 ENCOUNTER — Other Ambulatory Visit (HOSPITAL_BASED_OUTPATIENT_CLINIC_OR_DEPARTMENT_OTHER): Payer: Self-pay

## 2022-06-21 MED ORDER — VITAMIN D (ERGOCALCIFEROL) 1.25 MG (50000 UNIT) PO CAPS
50000.0000 [IU] | ORAL_CAPSULE | ORAL | 0 refills | Status: DC
  Filled 2022-06-21: qty 8, 56d supply, fill #0

## 2022-07-22 ENCOUNTER — Other Ambulatory Visit (HOSPITAL_BASED_OUTPATIENT_CLINIC_OR_DEPARTMENT_OTHER): Payer: Self-pay

## 2022-07-22 MED ORDER — WEGOVY 1 MG/0.5ML ~~LOC~~ SOAJ
1.0000 mg | SUBCUTANEOUS | 1 refills | Status: DC
  Filled 2022-07-22: qty 2, 28d supply, fill #0
  Filled 2022-08-12: qty 2, 28d supply, fill #1

## 2022-07-24 ENCOUNTER — Other Ambulatory Visit (HOSPITAL_BASED_OUTPATIENT_CLINIC_OR_DEPARTMENT_OTHER): Payer: Self-pay

## 2022-08-20 ENCOUNTER — Other Ambulatory Visit (HOSPITAL_BASED_OUTPATIENT_CLINIC_OR_DEPARTMENT_OTHER): Payer: Self-pay

## 2022-09-23 ENCOUNTER — Other Ambulatory Visit (HOSPITAL_BASED_OUTPATIENT_CLINIC_OR_DEPARTMENT_OTHER): Payer: Self-pay

## 2022-09-23 MED ORDER — WEGOVY 1.7 MG/0.75ML ~~LOC~~ SOAJ
1.7000 mg | SUBCUTANEOUS | 1 refills | Status: DC
  Filled 2022-09-23: qty 3, 28d supply, fill #0
  Filled 2022-10-23: qty 3, 28d supply, fill #1

## 2022-09-24 ENCOUNTER — Other Ambulatory Visit (HOSPITAL_BASED_OUTPATIENT_CLINIC_OR_DEPARTMENT_OTHER): Payer: Self-pay

## 2022-09-25 ENCOUNTER — Other Ambulatory Visit (HOSPITAL_BASED_OUTPATIENT_CLINIC_OR_DEPARTMENT_OTHER): Payer: Self-pay

## 2022-09-30 ENCOUNTER — Other Ambulatory Visit (HOSPITAL_BASED_OUTPATIENT_CLINIC_OR_DEPARTMENT_OTHER): Payer: Self-pay

## 2022-10-18 ENCOUNTER — Other Ambulatory Visit: Payer: Self-pay | Admitting: Internal Medicine

## 2022-10-18 DIAGNOSIS — Z1231 Encounter for screening mammogram for malignant neoplasm of breast: Secondary | ICD-10-CM

## 2022-10-18 DIAGNOSIS — E7800 Pure hypercholesterolemia, unspecified: Secondary | ICD-10-CM | POA: Diagnosis not present

## 2022-10-18 DIAGNOSIS — E559 Vitamin D deficiency, unspecified: Secondary | ICD-10-CM | POA: Diagnosis not present

## 2022-10-18 DIAGNOSIS — Z0000 Encounter for general adult medical examination without abnormal findings: Secondary | ICD-10-CM | POA: Diagnosis not present

## 2022-10-23 ENCOUNTER — Other Ambulatory Visit (HOSPITAL_BASED_OUTPATIENT_CLINIC_OR_DEPARTMENT_OTHER): Payer: Self-pay

## 2022-10-23 MED ORDER — VITAMIN D (ERGOCALCIFEROL) 1.25 MG (50000 UNIT) PO CAPS
50000.0000 [IU] | ORAL_CAPSULE | ORAL | 1 refills | Status: DC
  Filled 2022-10-23: qty 8, 56d supply, fill #0
  Filled 2022-12-25: qty 8, 56d supply, fill #1

## 2022-11-11 ENCOUNTER — Other Ambulatory Visit (HOSPITAL_BASED_OUTPATIENT_CLINIC_OR_DEPARTMENT_OTHER): Payer: Self-pay

## 2022-11-11 ENCOUNTER — Ambulatory Visit (HOSPITAL_COMMUNITY): Payer: Managed Care, Other (non HMO)

## 2022-11-11 DIAGNOSIS — R0981 Nasal congestion: Secondary | ICD-10-CM | POA: Diagnosis not present

## 2022-11-11 DIAGNOSIS — R059 Cough, unspecified: Secondary | ICD-10-CM | POA: Diagnosis not present

## 2022-11-11 DIAGNOSIS — Z03818 Encounter for observation for suspected exposure to other biological agents ruled out: Secondary | ICD-10-CM | POA: Diagnosis not present

## 2022-11-11 DIAGNOSIS — J029 Acute pharyngitis, unspecified: Secondary | ICD-10-CM | POA: Diagnosis not present

## 2022-11-11 DIAGNOSIS — R5383 Other fatigue: Secondary | ICD-10-CM | POA: Diagnosis not present

## 2022-11-11 MED ORDER — AMOXICILLIN 500 MG PO CAPS
500.0000 mg | ORAL_CAPSULE | Freq: Two times a day (BID) | ORAL | 0 refills | Status: DC
  Filled 2022-11-11: qty 20, 10d supply, fill #0

## 2022-11-12 ENCOUNTER — Other Ambulatory Visit (HOSPITAL_BASED_OUTPATIENT_CLINIC_OR_DEPARTMENT_OTHER): Payer: Self-pay

## 2022-11-26 ENCOUNTER — Other Ambulatory Visit (HOSPITAL_BASED_OUTPATIENT_CLINIC_OR_DEPARTMENT_OTHER): Payer: Self-pay

## 2022-11-26 ENCOUNTER — Ambulatory Visit: Payer: Managed Care, Other (non HMO)

## 2022-11-26 MED ORDER — WEGOVY 1.7 MG/0.75ML ~~LOC~~ SOAJ
1.7000 mg | SUBCUTANEOUS | 1 refills | Status: DC
  Filled 2022-11-26: qty 3, 28d supply, fill #0
  Filled 2022-12-22 – 2022-12-24 (×2): qty 3, 28d supply, fill #1

## 2022-11-29 ENCOUNTER — Other Ambulatory Visit (HOSPITAL_BASED_OUTPATIENT_CLINIC_OR_DEPARTMENT_OTHER): Payer: Self-pay

## 2022-11-29 ENCOUNTER — Encounter (HOSPITAL_BASED_OUTPATIENT_CLINIC_OR_DEPARTMENT_OTHER): Payer: Self-pay

## 2022-12-03 ENCOUNTER — Other Ambulatory Visit (HOSPITAL_BASED_OUTPATIENT_CLINIC_OR_DEPARTMENT_OTHER): Payer: Self-pay

## 2022-12-23 ENCOUNTER — Other Ambulatory Visit (HOSPITAL_BASED_OUTPATIENT_CLINIC_OR_DEPARTMENT_OTHER): Payer: Self-pay

## 2023-02-02 ENCOUNTER — Other Ambulatory Visit (HOSPITAL_BASED_OUTPATIENT_CLINIC_OR_DEPARTMENT_OTHER): Payer: Self-pay

## 2023-02-03 ENCOUNTER — Other Ambulatory Visit (HOSPITAL_BASED_OUTPATIENT_CLINIC_OR_DEPARTMENT_OTHER): Payer: Self-pay

## 2023-02-03 MED ORDER — WEGOVY 2.4 MG/0.75ML ~~LOC~~ SOAJ
2.4000 mg | SUBCUTANEOUS | 1 refills | Status: DC
  Filled 2023-02-03: qty 3, 28d supply, fill #0
  Filled 2023-02-27: qty 3, 28d supply, fill #1

## 2023-04-18 ENCOUNTER — Other Ambulatory Visit (HOSPITAL_BASED_OUTPATIENT_CLINIC_OR_DEPARTMENT_OTHER): Payer: Self-pay

## 2023-04-18 DIAGNOSIS — E7800 Pure hypercholesterolemia, unspecified: Secondary | ICD-10-CM | POA: Diagnosis not present

## 2023-04-18 DIAGNOSIS — Z6841 Body Mass Index (BMI) 40.0 and over, adult: Secondary | ICD-10-CM | POA: Diagnosis not present

## 2023-04-18 DIAGNOSIS — E559 Vitamin D deficiency, unspecified: Secondary | ICD-10-CM | POA: Diagnosis not present

## 2023-04-18 MED ORDER — WEGOVY 2.4 MG/0.75ML ~~LOC~~ SOAJ
2.4000 mg | SUBCUTANEOUS | 1 refills | Status: DC
  Filled 2023-04-18: qty 3, 28d supply, fill #0
  Filled 2023-05-17: qty 3, 28d supply, fill #1

## 2023-04-21 ENCOUNTER — Other Ambulatory Visit (HOSPITAL_BASED_OUTPATIENT_CLINIC_OR_DEPARTMENT_OTHER): Payer: Self-pay

## 2023-04-21 ENCOUNTER — Encounter (HOSPITAL_BASED_OUTPATIENT_CLINIC_OR_DEPARTMENT_OTHER): Payer: Self-pay

## 2023-06-10 ENCOUNTER — Other Ambulatory Visit (HOSPITAL_BASED_OUTPATIENT_CLINIC_OR_DEPARTMENT_OTHER): Payer: Self-pay

## 2023-06-10 MED ORDER — WEGOVY 2.4 MG/0.75ML ~~LOC~~ SOAJ
2.4000 mg | SUBCUTANEOUS | 0 refills | Status: DC
  Filled 2023-06-10 – 2023-06-24 (×2): qty 3, 28d supply, fill #0

## 2023-06-13 ENCOUNTER — Other Ambulatory Visit (HOSPITAL_BASED_OUTPATIENT_CLINIC_OR_DEPARTMENT_OTHER): Payer: Self-pay

## 2023-06-13 ENCOUNTER — Encounter (HOSPITAL_BASED_OUTPATIENT_CLINIC_OR_DEPARTMENT_OTHER): Payer: Self-pay

## 2023-06-16 ENCOUNTER — Other Ambulatory Visit (HOSPITAL_BASED_OUTPATIENT_CLINIC_OR_DEPARTMENT_OTHER): Payer: Self-pay

## 2023-06-24 ENCOUNTER — Other Ambulatory Visit (HOSPITAL_BASED_OUTPATIENT_CLINIC_OR_DEPARTMENT_OTHER): Payer: Self-pay

## 2023-08-03 ENCOUNTER — Other Ambulatory Visit (HOSPITAL_BASED_OUTPATIENT_CLINIC_OR_DEPARTMENT_OTHER): Payer: Self-pay

## 2023-08-04 ENCOUNTER — Other Ambulatory Visit (HOSPITAL_BASED_OUTPATIENT_CLINIC_OR_DEPARTMENT_OTHER): Payer: Self-pay

## 2023-08-04 MED ORDER — WEGOVY 2.4 MG/0.75ML ~~LOC~~ SOAJ
2.4000 mg | SUBCUTANEOUS | 1 refills | Status: DC
  Filled 2023-08-04: qty 3, 28d supply, fill #0
  Filled 2023-08-31: qty 3, 28d supply, fill #1

## 2023-09-15 DIAGNOSIS — J989 Respiratory disorder, unspecified: Secondary | ICD-10-CM | POA: Diagnosis not present

## 2023-09-18 ENCOUNTER — Encounter (HOSPITAL_COMMUNITY): Payer: Self-pay

## 2023-09-18 ENCOUNTER — Ambulatory Visit (HOSPITAL_COMMUNITY)
Admission: RE | Admit: 2023-09-18 | Discharge: 2023-09-18 | Disposition: A | Discharge status: Home / Self Care | Payer: Federal, State, Local not specified - PPO | Source: Ambulatory Visit | Attending: Family Medicine | Admitting: Family Medicine

## 2023-09-18 VITALS — BP 105/79 | HR 99 | Temp 98.8°F | Resp 18

## 2023-09-18 DIAGNOSIS — J069 Acute upper respiratory infection, unspecified: Secondary | ICD-10-CM | POA: Diagnosis not present

## 2023-09-18 MED ORDER — FLUTICASONE PROPIONATE 50 MCG/ACT NA SUSP: 1.0000 | Freq: Every day | NASAL | 0 refills | Status: DC

## 2023-09-18 NOTE — ED Triage Notes (Signed)
 Pt c/o cough, congestion, headache, and diarrhea x2wks. States saw her PCP on Monday and tx'd with amoxicillin. States not feeling any better. States her daughter has PNA.

## 2023-09-18 NOTE — Discharge Instructions (Signed)
 You have an upper respiratory infection. Most cases are due to a virus and do not require antibiotics for treatment.  Make sure to continue good oral hydration.  You can take tylenol for fever as needed Start Flonase daily for the next week and then as needed. You can use nasal saline spray multiple times daily as well.  You can use a daily antihistamine or guaifenesin as an expectorant but you need to be well hydrated for these medications to work.  Get adequate rest for recovery Maintain distance from others and wear a mask in public areas to avoid spread  If you start to experience shortness of breath, fevers that don't respond to medication, confusion, profound neck stiffness, or fainting, return to the urgent care or ED.

## 2023-09-18 NOTE — ED Provider Notes (Signed)
 [facility]    CSN: [id number] Arrival date & time: 09/18/23  0813      History   Chief Complaint Chief Complaint  Patient presents with   Headache    Congestion, diarrhea, coughing - Entered by patient   Cough    HPI [name] is a 43 y.o. female.   The patient is a 43 year old female presenting with 2 weeks of sinus congestion, cough, and chills.  She was seen by her primary care provider earlier this week and was given amoxicillin  which she started on Monday.  She developed some mild diarrhea on Tuesday afterwards but has been able to eat and drink fine without nausea or vomiting.  She does feel like she is slowly getting better.  She has been using Mucinex DM to help with sleep as well as Tylenol  intermittently.  She denies any neck stiffness, rashes or joint pain.   The history is provided by the patient.  Headache Associated symptoms: congestion, cough, diarrhea, drainage, sinus pressure and sore throat (after coughing)   Associated symptoms: no abdominal pain, no dizziness, no ear pain, no fever, no myalgias, no nausea, no neck pain, no neck stiffness and no vomiting   Cough Associated symptoms: chills, headaches, rhinorrhea and sore throat (after coughing)   Associated symptoms: no chest pain, no ear pain, no fever, no myalgias, no rash, no shortness of breath and no wheezing     Past Medical History:  Diagnosis Date   Abnormal uterine bleeding    Anemia    Dysmenorrhea    Fibroid    Obesity    SVD (spontaneous vaginal delivery) 2007   x x    Patient Active Problem List   Diagnosis Date Noted   Viral URI with cough 02/05/2021   History of COVID-19 02/05/2021   Breast mass 09/07/2019   Dermatitis 09/07/2019   Essential hypertension 09/07/2019   Status post laparoscopic hysterectomy 04/07/2018   Family history of breast cancer    Need for Tdap vaccination 08/06/2011   Obesity 08/06/2011    Past Surgical History:  Procedure Laterality  Date   ABDOMINAL HYSTERECTOMY     CESAREAN SECTION  2009   x 1   CYSTOSCOPY N/A 04/07/2018   Procedure: CYSTOSCOPY;  Surgeon: Jannis Kate Norris, MD;  Location: [facility];  Service: Gynecology;  Laterality: N/A;   LAPAROSCOPIC LYSIS OF ADHESIONS  04/07/2018   Procedure: EXTENSIVE LAPAROSCOPIC LYSIS OF ADHESIONS;  Surgeon: Jertson, Jill Evelyn, MD;  Location: [facility];  Service: Gynecology;;   TOTAL LAPAROSCOPIC HYSTERECTOMY WITH SALPINGECTOMY Bilateral 04/07/2018   Procedure: TOTAL LAPAROSCOPIC HYSTERECTOMY WITH SALPINGECTOMY;  Surgeon: Jannis Kate Norris, MD;  Location: [facility];  Service: Gynecology;  Laterality: Bilateral;  3 hours OR time. BMI 51.21   WISDOM TOOTH EXTRACTION      OB History     Gravida  3   Para  2   Term  2   Preterm  0   AB  1   Living  2      SAB      IAB  1   Ectopic      Multiple      Live Births  2            Home Medications    Prior to Admission medications   Medication Sig Start Date End Date Taking? Authorizing Provider  fluticasone  (FLONASE ) 50 MCG/ACT nasal spray Place 1 spray into both nostrils daily. 09/18/23  Yes Janet Lonni BRAVO, MD  Family History Family History  Problem Relation Age of Onset   Diabetes Mother    Hypertension Mother    Breast cancer Mother 78   Cancer Mother    Breast cancer Maternal Grandmother 34   Heart disease Neg Hx    Stroke Neg Hx     Social History Social History   Tobacco Use   Smoking status: Never   Smokeless tobacco: Never  Vaping Use   Vaping status: Never Used  Substance Use Topics   Alcohol use: No    Alcohol/week: 0.0 standard drinks of alcohol   Drug use: No     Allergies   Patient has no known allergies.   Review of Systems Review of Systems  Constitutional:  Positive for chills. Negative for appetite change and fever.  HENT:  Positive for congestion, postnasal drip, rhinorrhea, sinus pressure and sore throat (after coughing). Negative for ear pain, facial  swelling, mouth sores, sinus pain and trouble swallowing.   Respiratory:  Positive for cough. Negative for chest tightness, shortness of breath and wheezing.   Cardiovascular:  Negative for chest pain and palpitations.  Gastrointestinal:  Positive for diarrhea. Negative for abdominal pain, nausea and vomiting.  Musculoskeletal:  Negative for arthralgias, myalgias, neck pain and neck stiffness.  Skin:  Negative for rash.  Neurological:  Positive for headaches. Negative for dizziness.     Physical Exam Triage Vital Signs ED Triage Vitals [09/18/23 0841]  Encounter Vitals Group     BP 105/79     Systolic BP Percentile      Diastolic BP Percentile      Pulse Rate 99     Resp 18     Temp 98.8 F (37.1 C)     Temp Source Oral     SpO2 98 %     Weight      Height      Head Circumference      Peak Flow      Pain Score 7     Pain Loc      Pain Education      Exclude from Growth Chart    No data found.  Updated Vital Signs BP 105/79 (BP Location: Left Arm)   Pulse 99   Temp 98.8 F (37.1 C) (Oral)   Resp 18   LMP 03/31/2018 (Exact Date)   SpO2 98%   Visual Acuity Right Eye Distance:   Left Eye Distance:   Bilateral Distance:    Right Eye Near:   Left Eye Near:    Bilateral Near:     Physical Exam Vitals reviewed.  Constitutional:      General: She is not in acute distress.    Appearance: She is well-developed. She is not ill-appearing, toxic-appearing or diaphoretic.  HENT:     Head: Normocephalic and atraumatic.     Right Ear: External ear normal. There is no impacted cerumen.     Left Ear: External ear normal. There is no impacted cerumen.     Ears:     Comments: Clear effusions of the TMs bilaterally    Nose: Congestion present.     Mouth/Throat:     Mouth: Mucous membranes are moist.     Pharynx: No oropharyngeal exudate or posterior oropharyngeal erythema.  Eyes:     General: No scleral icterus.       Right eye: No discharge.        Left eye: No  discharge.     Extraocular Movements: Extraocular movements intact.  Pupils: Pupils are equal, round, and reactive to light.  Cardiovascular:     Rate and Rhythm: Normal rate and regular rhythm.     Pulses: Normal pulses.     Heart sounds: Normal heart sounds. No murmur heard. Pulmonary:     Effort: Pulmonary effort is normal.     Breath sounds: Normal breath sounds. No stridor. No wheezing, rhonchi or rales.  Abdominal:     General: Abdomen is flat. Bowel sounds are normal. There is no distension.     Tenderness: There is no abdominal tenderness.  Musculoskeletal:     Cervical back: Normal range of motion and neck supple.  Skin:    Capillary Refill: Capillary refill takes 2 to 3 seconds.  Neurological:     General: No focal deficit present.     Mental Status: She is alert and oriented to person, place, and time.  Psychiatric:        Mood and Affect: Mood normal.        Behavior: Behavior normal.      UC Treatments / Results  Labs (all labs ordered are listed, but only abnormal results are displayed) Labs Reviewed - No data to display  EKG   Radiology No results found.  Procedures Procedures (including critical care time)  Medications Ordered in UC Medications - No data to display  Initial Impression / Assessment and Plan / UC Course  I have reviewed the triage vital signs and the nursing notes.  Pertinent labs & imaging results that were available during my care of the patient were reviewed by me and considered in my medical decision making (see chart for details).     Upper respiratory infection, stable - This is likely viral.  The patient is stable with no red flag symptoms.  No indication for imaging today. - We discussed completing her antibiotic regiment and monitoring symptoms. - We also discussed further symptom management including the use of Flonase . - Return criteria discussed - The patient voiced understanding and agreement with the  plan.  Final Clinical Impressions(s) / UC Diagnoses   Final diagnoses:  Viral upper respiratory tract infection     Discharge Instructions      You have an upper respiratory infection. Most cases are due to a virus and do not require antibiotics for treatment.  Make sure to continue good oral hydration.  You can take tylenol  for fever as needed Start Flonase  daily for the next week and then as needed. You can use nasal saline spray multiple times daily as well.  You can use a daily antihistamine or guaifenesin as an expectorant but you need to be well hydrated for these medications to work.  Get adequate rest for recovery Maintain distance from others and wear a mask in public areas to avoid spread  If you start to experience shortness of breath, fevers that don't respond to medication, confusion, profound neck stiffness, or fainting, return to the urgent care or ED.       ED Prescriptions     Medication Sig Dispense Auth. Provider   fluticasone  (FLONASE ) 50 MCG/ACT nasal spray Place 1 spray into both nostrils daily. 1 g Janet Lonni BRAVO, MD      PDMP not reviewed this encounter.   Janet Lonni BRAVO, MD 09/18/23 [id number]

## 2023-09-19 ENCOUNTER — Telehealth (HOSPITAL_COMMUNITY): Payer: Self-pay

## 2023-09-19 NOTE — Telephone Encounter (Signed)
 This RN returned patient's phone call at this time. Name and date of birth verified. Pt states I was here at the urgent care yesterday. The doctor that saw me said that if I was not feeling better with the medication prescribed yesterday to call here today and they could prescribe me more medications. This RN told patient that she would have to come back to be reevaluated in order to be prescribed more medications. We could not do this over the phone. Pt was verbally upset. RN apologized and told her our hours for today.

## 2023-10-13 ENCOUNTER — Ambulatory Visit (HOSPITAL_COMMUNITY)
Admission: RE | Admit: 2023-10-13 | Discharge: 2023-10-13 | Disposition: A | Discharge status: Home / Self Care | Payer: Federal, State, Local not specified - PPO | Source: Ambulatory Visit | Attending: Physician Assistant | Admitting: Physician Assistant

## 2023-10-13 ENCOUNTER — Encounter (HOSPITAL_COMMUNITY): Payer: Self-pay

## 2023-10-13 VITALS — BP 152/88 | HR 104 | Temp 99.2°F | Resp 16 | Ht 62.0 in | Wt 248.0 lb

## 2023-10-13 DIAGNOSIS — J101 Influenza due to other identified influenza virus with other respiratory manifestations: Secondary | ICD-10-CM | POA: Diagnosis not present

## 2023-10-13 LAB — POCT RAPID STREP A (OFFICE): Rapid Strep A Screen: NEGATIVE

## 2023-10-13 LAB — POC COVID19/FLU A&B COMBO
Covid Antigen, POC: NEGATIVE
Influenza A Antigen, POC: POSITIVE — AB
Influenza B Antigen, POC: NEGATIVE

## 2023-10-13 MED ORDER — PREDNISONE 20 MG PO TABS: ORAL_TABLET | ORAL | 0 refills | Status: DC

## 2023-10-13 MED ORDER — OSELTAMIVIR PHOSPHATE 75 MG PO CAPS: 75.0000 mg | ORAL_CAPSULE | Freq: Two times a day (BID) | ORAL | 0 refills | Status: AC

## 2023-10-13 NOTE — ED Triage Notes (Signed)
Patient here today with c/o cough, nasal drainage, chest congestion, body aches, headache, fever, chills, and sweats since Friday evening. She has been taking Mucinex, Tylenol cold and flu, IBU, and Alka Seltzer with little relief. No known sick contacts. Patient states that these symptoms have been off and on for a month.

## 2023-10-13 NOTE — Discharge Instructions (Addendum)
Your testing was positive for Influenza   I have sent in a script for Tamiflu to treat this as you are still in the therapeutic window. Please start it as soon as possible for maximum benefit and resolution of symptoms.   I have sent in a script for Prednisone taper to be taken in the morning with breakfast per the instructions on the container Remember that steroids can cause sleeplessness, irritability, increased hunger and elevated glucose levels so be mindful of these side effects. They should lessen as you progress to the lower doses of the taper.  It can take a while for the antiviral to kick in so I recommend symptomatic relief with over the counter medication such as the following: Dayquil/ Nyquil Theraflu Alkaseltzer  These medications typically have Tylenol in them already so you can take Ibuprofen as needed for further pain and discomfort and fever management   If you have high blood pressure I recommend taking regular formulation Mucinex and Robitussin and Tylenol rather than a combination medication.   Stay well hydrated with at least 75 oz of water per day to help with recovery  If you notice any of the following please let us know: increased fever not responding to Tylenol or Ibuprofen, swelling around your nose or eyes, difficulty seeing,

## 2023-10-13 NOTE — ED Provider Notes (Addendum)
[facility]    CSN: [id number] Arrival date & time: 10/13/23  1203      History   Chief Complaint Chief Complaint  Patient presents with   Cough    Congested, body aches, chills, fever - Entered by patient    HPI [name] is a 43 y.o. female.   HPI  She reports she has had on and off symptoms since Christmas comprised of headaches, chest congestion and chest burning, rhinorrhea, coughing, postnasal drainage She reports losing her sense of taste on Sat- she has taken a home COVID test and this was negative  Interventions: mucinex, tylenol cold and flu, alkaseltzer She was given Augmentin on 12/30 by another provider - she reports limited benefit with this after finishing course.  She reports her daughter had walking pneumonia back in Dec.     Past Medical History:  Diagnosis Date   Abnormal uterine bleeding    Anemia    Dysmenorrhea    Fibroid    Obesity    SVD (spontaneous vaginal delivery) 2007   x x    Patient Active Problem List   Diagnosis Date Noted   Viral URI with cough 02/05/2021   History of COVID-19 02/05/2021   Breast mass 09/07/2019   Dermatitis 09/07/2019   Essential hypertension 09/07/2019   Status post laparoscopic hysterectomy 04/07/2018   Family history of breast cancer    Need for Tdap vaccination 08/06/2011   Obesity 08/06/2011    Past Surgical History:  Procedure Laterality Date   ABDOMINAL HYSTERECTOMY     CESAREAN SECTION  2009   x 1   CYSTOSCOPY N/A 04/07/2018   Procedure: CYSTOSCOPY;  Surgeon: Romualdo Bolk, MD;  Location: [facility];  Service: Gynecology;  Laterality: N/A;   LAPAROSCOPIC LYSIS OF ADHESIONS  04/07/2018   Procedure: EXTENSIVE LAPAROSCOPIC LYSIS OF ADHESIONS;  Surgeon: Romualdo Bolk, MD;  Location: [facility];  Service: Gynecology;;   TOTAL LAPAROSCOPIC HYSTERECTOMY WITH SALPINGECTOMY Bilateral 04/07/2018   Procedure: TOTAL LAPAROSCOPIC HYSTERECTOMY WITH SALPINGECTOMY;  Surgeon: Romualdo Bolk, MD;  Location: [facility];  Service: Gynecology;  Laterality: Bilateral;  3 hours OR time. BMI 51.21   WISDOM TOOTH EXTRACTION      OB History     Gravida  3   Para  2   Term  2   Preterm  0   AB  1   Living  2      SAB      IAB  1   Ectopic      Multiple      Live Births  2            Home Medications    Prior to Admission medications   Medication Sig Start Date End Date Taking? Authorizing Provider  oseltamivir (TAMIFLU) 75 MG capsule Take 1 capsule (75 mg total) by mouth 2 (two) times daily for 5 days. 10/13/23 10/18/23 Yes Genetta Fiero E, PA-C  predniSONE (DELTASONE) 20 MG tablet Take 60mg  PO daily x 2 days, then40mg  PO daily x 2 days, then 20mg  PO daily x 3 days 10/13/23  Yes Miraj Truss E, PA-C  fluticasone (FLONASE) 50 MCG/ACT nasal spray Place 1 spray into both nostrils daily. 09/18/23   Ivor Messier, MD    Family History Family History  Problem Relation Age of Onset   Diabetes Mother    Hypertension Mother    Breast cancer Mother 60   Cancer Mother    Breast cancer Maternal  Grandmother 42   Heart disease Neg Hx    Stroke Neg Hx     Social History Social History   Tobacco Use   Smoking status: Never   Smokeless tobacco: Never  Vaping Use   Vaping status: Never Used  Substance Use Topics   Alcohol use: No    Alcohol/week: 0.0 standard drinks of alcohol   Drug use: No     Allergies   Patient has no known allergies.   Review of Systems Review of Systems  Constitutional:  Positive for chills, diaphoresis, fatigue and fever (tmax; 103).  HENT:  Positive for congestion, postnasal drip and rhinorrhea. Negative for sore throat.   Respiratory:  Positive for cough.   Gastrointestinal:  Positive for diarrhea. Negative for nausea and vomiting.  Musculoskeletal:  Positive for myalgias.  Neurological:  Negative for dizziness, light-headedness and headaches.     Physical Exam Triage Vital Signs ED Triage Vitals  Encounter  Vitals Group     BP 10/13/23 1351 (!) 152/88     Systolic BP Percentile --      Diastolic BP Percentile --      Pulse Rate 10/13/23 1351 (!) 104     Resp 10/13/23 1351 16     Temp 10/13/23 1351 99.2 F (37.3 C)     Temp Source 10/13/23 1351 Oral     SpO2 10/13/23 1351 97 %     Weight 10/13/23 1351 248 lb (112.5 kg)     Height 10/13/23 1351 5\' 2"  (1.575 m)     Head Circumference --      Peak Flow --      Pain Score 10/13/23 1352 0     Pain Loc --      Pain Education --      Exclude from Growth Chart --    No data found.  Updated Vital Signs BP (!) 152/88 (BP Location: Left Arm)   Pulse (!) 104   Temp 99.2 F (37.3 C) (Oral)   Resp 16   Ht 5\' 2"  (1.575 m)   Wt 248 lb (112.5 kg)   LMP 03/31/2018 (Exact Date)   SpO2 97%   BMI 45.36 kg/m   Visual Acuity Right Eye Distance:   Left Eye Distance:   Bilateral Distance:    Right Eye Near:   Left Eye Near:    Bilateral Near:     Physical Exam Vitals reviewed.  Constitutional:      General: She is awake.     Appearance: She is well-developed and well-groomed. She is ill-appearing.  HENT:     Head: Normocephalic and atraumatic.     Right Ear: Hearing, tympanic membrane and ear canal normal.     Left Ear: Hearing, tympanic membrane and ear canal normal.     Mouth/Throat:     Lips: Pink.     Mouth: Mucous membranes are moist.     Pharynx: Uvula midline. No pharyngeal swelling, oropharyngeal exudate, posterior oropharyngeal erythema or postnasal drip.     Tonsils: Tonsillar exudate present. 1+ on the left.  Cardiovascular:     Rate and Rhythm: Normal rate and regular rhythm.     Heart sounds: Normal heart sounds.  Pulmonary:     Effort: Pulmonary effort is normal.     Breath sounds: Decreased air movement present. No decreased breath sounds, wheezing, rhonchi or rales.  Musculoskeletal:     Cervical back: Normal range of motion and neck supple.  Lymphadenopathy:     Head:  Right side of head: No submental,  submandibular or preauricular adenopathy.     Left side of head: No submental, submandibular or preauricular adenopathy.     Cervical:     Right cervical: No superficial cervical adenopathy.    Left cervical: No superficial cervical adenopathy.     Upper Body:     Right upper body: No supraclavicular adenopathy.     Left upper body: No supraclavicular adenopathy.  Neurological:     Mental Status: She is alert.  Psychiatric:        Behavior: Behavior is cooperative.      UC Treatments / Results  Labs (all labs ordered are listed, but only abnormal results are displayed) Labs Reviewed  POC COVID19/FLU A&B COMBO - Abnormal; Notable for the following components:      Result Value   Influenza A Antigen, POC Positive (*)    All other components within normal limits  POCT RAPID STREP A (OFFICE)    EKG   Radiology No results found.  Procedures Procedures (including critical care time)  Medications Ordered in UC Medications - No data to display  Initial Impression / Assessment and Plan / UC Course  I have reviewed the triage vital signs and the nursing notes.  Pertinent labs & imaging results that were available during my care of the patient were reviewed by me and considered in my medical decision making (see chart for details).    Patient presents with almost a month -long hx of upper respiratory illness with recent exacerbation a few days ago.  Lungs sound a bit tight today but overall clear. Patient is ill appearing- will get testing for Flu, COVID, and strep due to purulent exudate on bilateral tonsils    Final Clinical Impressions(s) / UC Diagnoses   Final diagnoses:  Influenza A   Acute, new concern Patient testing was positive for Flu A which is consistent with her HPI and symptoms Suspect recent infection given new resurgence of symptoms per HPI. Will send in script for Tamiflu and reviewed OTC medications for symptomatic relief Will send in script for  Prednisone to assist with SOB  Reviewed ED and return precautions. Follow up as needed for persistent or progressing symptoms     Discharge Instructions      Your testing was positive for Influenza   I have sent in a script for Tamiflu to treat this as you are still in the therapeutic window. Please start it as soon as possible for maximum benefit and resolution of symptoms.   I have sent in a script for Prednisone taper to be taken in the morning with breakfast per the instructions on the container Remember that steroids can cause sleeplessness, irritability, increased hunger and elevated glucose levels so be mindful of these side effects. They should lessen as you progress to the lower doses of the taper.  It can take a while for the antiviral to kick in so I recommend symptomatic relief with over the counter medication such as the following: Dayquil/ Nyquil Theraflu Alkaseltzer  These medications typically have Tylenol in them already so you can take Ibuprofen as needed for further pain and discomfort and fever management   If you have high blood pressure I recommend taking regular formulation Mucinex and Robitussin and Tylenol rather than a combination medication.   Stay well hydrated with at least 75 oz of water per day to help with recovery  If you notice any of the following please let us know: increased fever  not responding to Tylenol or Ibuprofen, swelling around your nose or eyes, difficulty seeing,       ED Prescriptions     Medication Sig Dispense Auth. Provider   oseltamivir (TAMIFLU) 75 MG capsule Take 1 capsule (75 mg total) by mouth 2 (two) times daily for 5 days. 10 capsule Irvan Tiedt E, PA-C   predniSONE (DELTASONE) 20 MG tablet Take 60mg  PO daily x 2 days, then40mg  PO daily x 2 days, then 20mg  PO daily x 3 days 13 tablet Kolbie Clarkston E, PA-C      PDMP not reviewed this encounter.   Providence Crosby, PA-C 10/13/23 1434    Azlyn Wingler, Oswaldo Conroy, PA-C 10/13/23  1434

## 2023-10-20 DIAGNOSIS — D649 Anemia, unspecified: Secondary | ICD-10-CM | POA: Diagnosis not present

## 2023-10-20 DIAGNOSIS — Z0000 Encounter for general adult medical examination without abnormal findings: Secondary | ICD-10-CM | POA: Diagnosis not present

## 2023-10-20 DIAGNOSIS — E7800 Pure hypercholesterolemia, unspecified: Secondary | ICD-10-CM | POA: Diagnosis not present

## 2023-10-20 DIAGNOSIS — E559 Vitamin D deficiency, unspecified: Secondary | ICD-10-CM | POA: Diagnosis not present

## 2023-11-04 ENCOUNTER — Ambulatory Visit (HOSPITAL_COMMUNITY)
Admission: RE | Admit: 2023-11-04 | Discharge: 2023-11-04 | Disposition: A | Discharge status: Home / Self Care | Payer: Federal, State, Local not specified - PPO | Source: Ambulatory Visit | Attending: Emergency Medicine | Admitting: Emergency Medicine

## 2023-11-04 ENCOUNTER — Encounter (HOSPITAL_COMMUNITY): Payer: Self-pay

## 2023-11-04 VITALS — BP 144/89 | HR 114 | Temp 99.3°F | Resp 16 | Ht 62.0 in | Wt 259.0 lb

## 2023-11-04 DIAGNOSIS — R197 Diarrhea, unspecified: Secondary | ICD-10-CM

## 2023-11-04 DIAGNOSIS — R112 Nausea with vomiting, unspecified: Secondary | ICD-10-CM

## 2023-11-04 DIAGNOSIS — A084 Viral intestinal infection, unspecified: Secondary | ICD-10-CM

## 2023-11-04 LAB — POC COVID19/FLU A&B COMBO
Covid Antigen, POC: NEGATIVE
Influenza A Antigen, POC: NEGATIVE
Influenza B Antigen, POC: NEGATIVE

## 2023-11-04 MED ORDER — ONDANSETRON 4 MG PO TBDP
4.0000 mg | ORAL_TABLET | Freq: Once | ORAL | Status: AC
  Administered 2023-11-04: 4 mg via ORAL

## 2023-11-04 MED ORDER — LOPERAMIDE HCL 2 MG PO TABS
2.0000 mg | ORAL_TABLET | Freq: Four times a day (QID) | ORAL | 0 refills | Status: AC | PRN
Start: 2023-11-04 — End: ?

## 2023-11-04 MED ORDER — ONDANSETRON 4 MG PO TBDP
ORAL_TABLET | ORAL | Status: AC
  Filled 2023-11-04: qty 1

## 2023-11-04 MED ORDER — ONDANSETRON 4 MG PO TBDP: 4.0000 mg | ORAL_TABLET | Freq: Three times a day (TID) | ORAL | 0 refills | Status: AC | PRN

## 2023-11-04 NOTE — ED Provider Notes (Signed)
[facility]    CSN: [id number] Arrival date & time: 11/04/23  1516    HISTORY   Chief Complaint  Patient presents with   Diarrhea    Vomiting - Entered by patient   HPI [name] is a pleasant, 43 y.o. female who presents to urgent care today. Patient states that last night she began to have diarrhea, nausea, vomiting, intermittent abdominal pain, weakness and intermittent fever.  Patient states the diarrhea seems to be improving but she is still feeling nauseous and unable to tolerate p.o. food or liquids.  Patient with elevated blood pressure, heart rate and temperature on arrival today.  The history is provided by the patient.   Past Medical History:  Diagnosis Date   Abnormal uterine bleeding    Anemia    Dysmenorrhea    Fibroid    Obesity    SVD (spontaneous vaginal delivery) 2007   x x   Patient Active Problem List   Diagnosis Date Noted   Viral URI with cough 02/05/2021   History of COVID-19 02/05/2021   Breast mass 09/07/2019   Dermatitis 09/07/2019   Essential hypertension 09/07/2019   Status post laparoscopic hysterectomy 04/07/2018   Family history of breast cancer    Need for Tdap vaccination 08/06/2011   Obesity 08/06/2011   Past Surgical History:  Procedure Laterality Date   ABDOMINAL HYSTERECTOMY     CESAREAN SECTION  2009   x 1   CYSTOSCOPY N/A 04/07/2018   Procedure: CYSTOSCOPY;  Surgeon: Romualdo Bolk, MD;  Location: [facility];  Service: Gynecology;  Laterality: N/A;   LAPAROSCOPIC LYSIS OF ADHESIONS  04/07/2018   Procedure: EXTENSIVE LAPAROSCOPIC LYSIS OF ADHESIONS;  Surgeon: Romualdo Bolk, MD;  Location: [facility];  Service: Gynecology;;   TOTAL LAPAROSCOPIC HYSTERECTOMY WITH SALPINGECTOMY Bilateral 04/07/2018   Procedure: TOTAL LAPAROSCOPIC HYSTERECTOMY WITH SALPINGECTOMY;  Surgeon: Romualdo Bolk, MD;  Location: [facility];  Service: Gynecology;  Laterality: Bilateral;  3 hours OR time. BMI 51.21   WISDOM TOOTH  EXTRACTION     OB History     Gravida  3   Para  2   Term  2   Preterm  0   AB  1   Living  2      SAB      IAB  1   Ectopic      Multiple      Live Births  2          Home Medications    Prior to Admission medications   Not on File    Family History Family History  Problem Relation Age of Onset   Diabetes Mother    Hypertension Mother    Breast cancer Mother 64   Cancer Mother    Breast cancer Maternal Grandmother 48   Heart disease Neg Hx    Stroke Neg Hx    Social History Social History   Tobacco Use   Smoking status: Never   Smokeless tobacco: Never  Vaping Use   Vaping status: Never Used  Substance Use Topics   Alcohol use: No    Alcohol/week: 0.0 standard drinks of alcohol   Drug use: No   Allergies   Patient has no known allergies.  Review of Systems Review of Systems Pertinent findings revealed after performing a 14 point review of systems has been noted in the history of present illness.  Physical Exam Vital Signs BP (!) 144/89 (BP Location: Left Arm)   Pulse Marland Kitchen)  114   Temp 99.3 F (37.4 C) (Oral)   Resp 16   Ht 5\' 2"  (1.575 m)   Wt 259 lb (117.5 kg)   LMP 03/31/2018 (Exact Date)   SpO2 100%   BMI 47.37 kg/m   No data found.  Physical Exam Vitals and nursing note reviewed.  Constitutional:      General: She is not in acute distress.    Appearance: Normal appearance.  HENT:     Head: Normocephalic and atraumatic.  Eyes:     Pupils: Pupils are equal, round, and reactive to light.  Cardiovascular:     Rate and Rhythm: Normal rate and regular rhythm.  Pulmonary:     Effort: Pulmonary effort is normal.     Breath sounds: Normal breath sounds.  Abdominal:     General: Abdomen is flat. Bowel sounds are normal.     Palpations: Abdomen is soft.     Tenderness: There is generalized abdominal tenderness.  Musculoskeletal:        General: Normal range of motion.     Cervical back: Normal range of motion and neck  supple.  Skin:    General: Skin is warm and dry.  Neurological:     General: No focal deficit present.     Mental Status: She is alert and oriented to person, place, and time. Mental status is at baseline.  Psychiatric:        Mood and Affect: Mood normal.        Behavior: Behavior normal.        Thought Content: Thought content normal.        Judgment: Judgment normal.     Visual Acuity Right Eye Distance:   Left Eye Distance:   Bilateral Distance:    Right Eye Near:   Left Eye Near:    Bilateral Near:     UC Couse / Diagnostics / Procedures:     Radiology No results found.  Procedures Procedures (including critical care time) EKG  Pending results:  Labs Reviewed  POC COVID19/FLU A&B COMBO    Medications Ordered in UC: Medications  ondansetron (ZOFRAN-ODT) disintegrating tablet 4 mg (4 mg Oral Given 11/04/23 1553)    UC Diagnoses / Final Clinical Impressions(s)   I have reviewed the triage vital signs and the nursing notes.  Pertinent labs & imaging results that were available during my care of the patient were reviewed by me and considered in my medical decision making (see chart for details).    Final diagnoses:  Nausea vomiting and diarrhea  Viral gastroenteritis   Patient history and physical exam findings concerning for viral gastroenteritis.  Patient provided with prescription for Zofran and Imodium for relief of nausea vomiting and diarrhea, respectively.  Note provided for work.  Patient education provided.  Conservative care recommended.  Return precautions advised.  Please see discharge instructions below for details of plan of care as provided to patient. ED Prescriptions     Medication Sig Dispense Auth. Provider   ondansetron (ZOFRAN-ODT) 4 MG disintegrating tablet Take 1 tablet (4 mg total) by mouth every 8 (eight) hours as needed for nausea or vomiting. 20 tablet Theadora Rama Scales, PA-C   loperamide (IMODIUM A-D) 2 MG tablet Take 1 tablet  (2 mg total) by mouth 4 (four) times daily as needed for diarrhea or loose stools. 30 tablet Theadora Rama Scales, PA-C      PDMP not reviewed this encounter.  Pending results:  Labs Reviewed  POC COVID19/FLU A&B COMBO  Discharge Instructions      I have enclosed some information about viral gastroenteritis that I hope you find helpful.  I sent a prescription for Zofran to your pharmacy, please take 1 tablet every 8 hours as needed for nausea and vomiting.  Your next dose should be due around midnight.  For diarrhea, I provided you with a prescription for Imodium A-D.  You will take 1 tablet every time you have diarrhea.  Thank you for visiting  Urgent Care today.      Disposition Upon Discharge:  Condition: stable for discharge home  Patient presented with an acute illness with associated systemic symptoms and significant discomfort requiring urgent management. In my opinion, this is a condition that a prudent lay person (someone who possesses an average knowledge of health and medicine) may potentially expect to result in complications if not addressed urgently such as respiratory distress, impairment of bodily function or dysfunction of bodily organs.   Routine symptom specific, illness specific and/or disease specific instructions were discussed with the patient and/or caregiver at length.   As such, the patient has been evaluated and assessed, work-up was performed and treatment was provided in alignment with urgent care protocols and evidence based medicine.  Patient/parent/caregiver has been advised that the patient may require follow up for further testing and treatment if the symptoms continue in spite of treatment, as clinically indicated and appropriate.  Patient/parent/caregiver has been advised to return to the [facility] or PCP if no better; to PCP or the Emergency Department if new signs and symptoms develop, or if the current signs or symptoms continue to  change or worsen for further workup, evaluation and treatment as clinically indicated and appropriate  The patient will follow up with their current PCP if and as advised. If the patient does not currently have a PCP we will assist them in obtaining one.   The patient may need specialty follow up if the symptoms continue, in spite of conservative treatment and management, for further workup, evaluation, consultation and treatment as clinically indicated and appropriate.  Patient/parent/caregiver verbalized understanding and agreement of plan as discussed.  All questions were addressed during visit.  Please see discharge instructions below for further details of plan.  This office note has been dictated using Teaching laboratory technician.  Unfortunately, this method of dictation can sometimes lead to typographical or grammatical errors.  I apologize for your inconvenience in advance if this occurs.  Please do not hesitate to reach out to me if clarification is needed.      Theadora Rama Scales, PA-C 11/04/23 1718

## 2023-11-04 NOTE — Discharge Instructions (Addendum)
I have enclosed some information about viral gastroenteritis that I hope you find helpful.  I sent a prescription for Zofran to your pharmacy, please take 1 tablet every 8 hours as needed for nausea and vomiting.  Your next dose should be due around midnight.  For diarrhea, I provided you with a prescription for Imodium A-D.  You will take 1 tablet every time you have diarrhea.  Thank you for visiting [facility] today.

## 2023-11-04 NOTE — ED Triage Notes (Signed)
Patient here today with c/o diarrhea, nausea, vomiting, abd pain off and on, weakness, and fever off and on since last night. Diarrhea seems to have improved but still feeling nauseous. Unable to keep anything down.

## 2024-06-28 DIAGNOSIS — Z131 Encounter for screening for diabetes mellitus: Secondary | ICD-10-CM | POA: Diagnosis not present

## 2024-06-28 DIAGNOSIS — Z1329 Encounter for screening for other suspected endocrine disorder: Secondary | ICD-10-CM | POA: Diagnosis not present

## 2024-06-28 DIAGNOSIS — E559 Vitamin D deficiency, unspecified: Secondary | ICD-10-CM | POA: Diagnosis not present

## 2024-06-28 DIAGNOSIS — Z130 Encounter for screening for diseases of the blood and blood-forming organs and certain disorders involving the immune mechanism: Secondary | ICD-10-CM | POA: Diagnosis not present

## 2024-06-28 DIAGNOSIS — N951 Menopausal and female climacteric states: Secondary | ICD-10-CM | POA: Diagnosis not present

## 2024-06-28 DIAGNOSIS — E7800 Pure hypercholesterolemia, unspecified: Secondary | ICD-10-CM | POA: Diagnosis not present

## 2024-06-28 DIAGNOSIS — R635 Abnormal weight gain: Secondary | ICD-10-CM | POA: Diagnosis not present

## 2024-07-05 DIAGNOSIS — Z1331 Encounter for screening for depression: Secondary | ICD-10-CM | POA: Diagnosis not present

## 2024-07-05 DIAGNOSIS — E7800 Pure hypercholesterolemia, unspecified: Secondary | ICD-10-CM | POA: Diagnosis not present

## 2024-07-05 DIAGNOSIS — R635 Abnormal weight gain: Secondary | ICD-10-CM | POA: Diagnosis not present
